# Patient Record
Sex: Female | Born: 1970 | Race: Asian | Hispanic: No | Marital: Married | Smoking: Never smoker
Health system: Southern US, Community
[De-identification: ages and names within clinical notes are randomized; demographics above are authoritative.]

## PROBLEM LIST (undated history)

## (undated) DIAGNOSIS — F419 Anxiety disorder, unspecified: Secondary | ICD-10-CM

## (undated) HISTORY — DX: Anxiety disorder, unspecified: F41.9

---

## 2002-09-22 ENCOUNTER — Other Ambulatory Visit: Admission: RE | Admit: 2002-09-22 | Discharge: 2002-09-22 | Payer: Self-pay | Admitting: Gynecology

## 2003-02-23 ENCOUNTER — Encounter: Admission: RE | Admit: 2003-02-23 | Discharge: 2003-02-23 | Payer: Self-pay | Admitting: Gynecology

## 2003-04-05 ENCOUNTER — Inpatient Hospital Stay (HOSPITAL_COMMUNITY): Admission: AD | Admit: 2003-04-05 | Discharge: 2003-04-08 | Payer: Self-pay | Admitting: Gynecology

## 2003-05-18 ENCOUNTER — Other Ambulatory Visit: Admission: RE | Admit: 2003-05-18 | Discharge: 2003-05-18 | Payer: Self-pay | Admitting: Gynecology

## 2004-07-27 ENCOUNTER — Ambulatory Visit: Payer: Self-pay | Admitting: Family Medicine

## 2009-10-29 ENCOUNTER — Inpatient Hospital Stay (HOSPITAL_COMMUNITY): Admission: AD | Admit: 2009-10-29 | Discharge: 2009-10-29 | Payer: Self-pay | Admitting: Obstetrics and Gynecology

## 2009-12-08 ENCOUNTER — Ambulatory Visit (HOSPITAL_COMMUNITY): Admission: RE | Admit: 2009-12-08 | Discharge: 2009-12-08 | Payer: Self-pay | Admitting: Obstetrics

## 2010-01-19 ENCOUNTER — Ambulatory Visit (HOSPITAL_COMMUNITY): Admission: RE | Admit: 2010-01-19 | Discharge: 2010-01-19 | Payer: Self-pay | Admitting: Obstetrics

## 2010-04-12 ENCOUNTER — Inpatient Hospital Stay (HOSPITAL_COMMUNITY): Admission: AD | Admit: 2010-04-12 | Discharge: 2010-04-14 | Payer: Self-pay | Admitting: Obstetrics

## 2010-08-22 LAB — CBC
HCT: 25.4 % — ABNORMAL LOW (ref 36.0–46.0)
Hemoglobin: 8.6 g/dL — ABNORMAL LOW (ref 12.0–15.0)
MCH: 30.9 pg (ref 26.0–34.0)
MCHC: 33.8 g/dL (ref 30.0–36.0)
MCV: 91.5 fL (ref 78.0–100.0)
Platelets: 148 10*3/uL — ABNORMAL LOW (ref 150–400)
RBC: 2.78 MIL/uL — ABNORMAL LOW (ref 3.87–5.11)
RDW: 14.2 % (ref 11.5–15.5)
WBC: 8.1 10*3/uL (ref 4.0–10.5)
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
## 2011-10-31 ENCOUNTER — Ambulatory Visit (INDEPENDENT_AMBULATORY_CARE_PROVIDER_SITE_OTHER): Payer: BC Managed Care – PPO | Admitting: Family Medicine

## 2011-10-31 VITALS — BP 117/80 | Ht 61.0 in | Wt 135.0 lb

## 2011-10-31 DIAGNOSIS — M25569 Pain in unspecified knee: Secondary | ICD-10-CM

## 2011-10-31 DIAGNOSIS — M79609 Pain in unspecified limb: Secondary | ICD-10-CM

## 2011-10-31 DIAGNOSIS — M79671 Pain in right foot: Secondary | ICD-10-CM

## 2011-10-31 DIAGNOSIS — M25561 Pain in right knee: Secondary | ICD-10-CM

## 2011-10-31 DIAGNOSIS — M722 Plantar fascial fibromatosis: Secondary | ICD-10-CM

## 2011-10-31 NOTE — Patient Instructions (Signed)
You have plantar fasciitis Take tylenol or aleve as needed for pain  Plantar fascia stretch for 20-30 seconds (do 3 of these) in morning Lowering/raise on a step exercises 3 x 10 once or twice a day - this is very important for long term recovery. Can add heel walks, toe walks forward and backward as well Ice heel for 15 minutes as needed. Avoid flat shoes/barefoot walking as much as possible. Arch straps have been shown to help with pain. Heel lifts may help with pain by avoiding fully stretching the plantar fascia except when doing home exercises. Orthotics with heel lift may be helpful. Steroid injection is a consideration for short term pain relief if you are struggling. Physical therapy is also an option. I would recommend continued running but start with 1:1 ZOX:WRUE for total of 10 minutes and increase jog time and total exercise time with each session. Follow up with me in 5-6 weeks for reevaluation.

## 2011-11-01 ENCOUNTER — Ambulatory Visit: Payer: Self-pay | Admitting: Family Medicine

## 2011-11-01 ENCOUNTER — Encounter: Payer: Self-pay | Admitting: Family Medicine

## 2011-11-01 DIAGNOSIS — M79671 Pain in right foot: Secondary | ICD-10-CM | POA: Insufficient documentation

## 2011-11-01 DIAGNOSIS — M25561 Pain in right knee: Secondary | ICD-10-CM | POA: Insufficient documentation

## 2011-11-01 NOTE — Progress Notes (Signed)
  Subjective:    Patient ID: Christy Sims, female    DOB: [birth year], 41 y.o.   MRN: [id number]  PCP: Dr. Noland Fordyce  HPI 41 yo F here for right foot and knee pain.  Patient reports for past 6 years she has had planter right foot pain in mid-arch. Didn't have any injury or trauma to start this. Was worse with flat shoes and without heels. Decreased activity level and started wearing shoes with good arch support and this improved though not completely. Did some stretching as well. Past 3-4 weeks has started to get back into running - ran 1 1/2 miles and felt good during but afterwards had pain worsening in right arch again. Scaled back running again. Not taking any meds or icing. Past few days medial right knee has been hurting also but no injury. No catching, locking, giving out of right knee.  History reviewed. No pertinent past medical history.  No current outpatient prescriptions on file prior to visit.    History reviewed. No pertinent past surgical history.  No Known Allergies  History   Social History  . Marital Status: Married    Spouse Name: N/A    Number of Children: N/A  . Years of Education: N/A   Occupational History  . Not on file.   Social History Main Topics  . Smoking status: Never Smoker   . Smokeless tobacco: Not on file  . Alcohol Use: Not on file  . Drug Use: Not on file  . Sexually Active: Not on file   Other Topics Concern  . Not on file   Social History Narrative  . No narrative on file    Family History  Problem Relation Age of Onset  . Diabetes Mother   . Hyperlipidemia Mother   . Hypertension Mother   . Diabetes Father   . Hyperlipidemia Father   . Hypertension Father   . Heart attack Neg Hx   . Sudden death Neg Hx     BP 117/80  Ht 5\' 1"  (1.549 m)  Wt 135 lb (61.236 kg)  BMI 25.51 kg/m2  Review of Systems See HPI above.    Objective:   Physical Exam Gen: NAD  R knee: No gross deformity, ecchymoses,  swelling. No TTP. FROM. Negative ant/post drawers. Negative valgus/varus testing. Negative lachmanns. Negative mcmurrays, apleys, patellar apprehension, clarkes. NV intact distally.  R foot: Mod pes planus. No gross deformity, swelling, bruising. TTP mid-portion arch medially.  No heel, other TTP about foot or ankle. FROM ankle without pain - 5/5 strength all directions. Neg ant drawer and talar tilt.  Neg thompsons. NVI distally.     Assessment & Plan:  1. Right foot pain - 2/2 plantar fasciitis.  Start home rehab protocol which was demonstrated today.  Arch straps.  Scaphoid pads provided.  Discussed OTC orthotics.  Icing, tylenol/motrin as needed.  Do not think cortisone injection would provide her much relief.  Consider custom orthotics and/or PT if not improving as expected.  2. Right knee pain - Believe this is a dynamic issue related to issue #1 - benign knee exam.  Reassured.

## 2011-11-01 NOTE — Assessment & Plan Note (Signed)
Believe this is a dynamic issue related to issue #1 - benign knee exam.  Reassured.

## 2011-11-01 NOTE — Assessment & Plan Note (Signed)
2/2 plantar fasciitis.  Start home rehab protocol which was demonstrated today.  Arch straps.  Scaphoid pads provided.  Discussed OTC orthotics.  Icing, tylenol/motrin as needed.  Do not think cortisone injection would provide her much relief.  Consider custom orthotics and/or PT if not improving as expected.

## 2012-08-07 ENCOUNTER — Ambulatory Visit: Payer: BC Managed Care – PPO | Admitting: Internal Medicine

## 2012-09-04 ENCOUNTER — Ambulatory Visit: Payer: BC Managed Care – PPO | Admitting: Internal Medicine

## 2012-09-22 ENCOUNTER — Ambulatory Visit (INDEPENDENT_AMBULATORY_CARE_PROVIDER_SITE_OTHER): Payer: BC Managed Care – PPO | Admitting: Internal Medicine

## 2012-09-22 ENCOUNTER — Encounter: Payer: Self-pay | Admitting: Internal Medicine

## 2012-09-22 VITALS — BP 110/70 | HR 53 | Temp 97.4°F | Resp 18 | Wt 120.0 lb

## 2012-09-22 DIAGNOSIS — J309 Allergic rhinitis, unspecified: Secondary | ICD-10-CM | POA: Insufficient documentation

## 2012-09-22 DIAGNOSIS — A6000 Herpesviral infection of urogenital system, unspecified: Secondary | ICD-10-CM

## 2012-09-22 DIAGNOSIS — F329 Major depressive disorder, single episode, unspecified: Secondary | ICD-10-CM

## 2012-09-22 DIAGNOSIS — F53 Postpartum depression: Secondary | ICD-10-CM | POA: Insufficient documentation

## 2012-09-22 DIAGNOSIS — O99345 Other mental disorders complicating the puerperium: Secondary | ICD-10-CM

## 2012-09-22 NOTE — Patient Instructions (Addendum)
See me as needed  Have fasiting labs activate my chart

## 2012-09-22 NOTE — Progress Notes (Signed)
  Subjective:    Patient ID: Christy Sims, female    DOB: 10-16-1970, 42 y.o.   MRN: [id number]  HPI Christy Sims is a new pt. here to establish care.  PMH  Of post partum depression and anxiety  (treated with Klonopin and Zoloft)  , allergic rhinitis and recent plantar fasciitis.    Overall doing well  Christy Sims is concerned about family history of diabetes and would like to be checked No polyuria no polyphagia  No Known Allergies History reviewed. No pertinent past medical history. History reviewed. No pertinent past surgical history. History   Social History  . Marital Status: Married    Spouse Name: N/A    Number of Children: N/A  . Years of Education: N/A   Occupational History  . Not on file.   Social History Main Topics  . Smoking status: Never Smoker   . Smokeless tobacco: Not on file  . Alcohol Use: No  . Drug Use: No  . Sexually Active: Yes    Birth Control/ Protection: Condom   Other Topics Concern  . Not on file   Social History Narrative  . No narrative on file   Family History  Problem Relation Age of Onset  . Diabetes Mother   . Hyperlipidemia Mother   . Hypertension Mother   . Diabetes Father   . Hyperlipidemia Father   . Hypertension Father   . Heart attack Neg Hx   . Sudden death Neg Hx    Patient Active Problem List  Diagnosis  . Plantar fasciitis  . Right foot pain  . Right knee pain  . Post partum depression  . Allergic rhinitis  . Genital herpes   No current outpatient prescriptions on file prior to visit.   No current facility-administered medications on file prior to visit.       Review of Systems See HPI    Objective:   Physical Exam  Physical Exam  Nursing note and vitals reviewed.  Constitutional: Christy Sims is oriented to person, place, and time. Christy Sims appears well-developed and well-nourished.  HENT:  Head: Normocephalic and atraumatic.  Cardiovascular: Normal rate and regular rhythm. Exam reveals no gallop and no friction rub.  No  murmur heard.  Pulmonary/Chest: Breath sounds normal. Christy Sims has no wheezes. Christy Sims has no rales.  Neurological: Christy Sims is alert and oriented to person, place, and time.  Skin: Skin is warm and dry.  Psychiatric: Christy Sims has a normal mood and affect. Her behavior is normal.            Assessment & Plan:  Allergic rhinitis  OTC antihistamine of choice  Postpartum derpression  Quiescent now  Had beenon Klonopin and Zoloft in the past Check labs today    History of genital herpes  Plantar fasciitis.  I spent 45 mins with this pt.

## 2012-09-23 ENCOUNTER — Other Ambulatory Visit: Payer: Self-pay | Admitting: Internal Medicine

## 2012-09-23 LAB — CBC WITH DIFFERENTIAL/PLATELET
HCT: 37 % (ref 36.0–46.0)
Hemoglobin: 12.9 g/dL (ref 12.0–15.0)
MCH: 32.9 pg (ref 26.0–34.0)
MCHC: 34.9 g/dL (ref 30.0–36.0)
MCV: 94.4 fL (ref 78.0–100.0)
Monocytes Absolute: 0.3 10*3/uL (ref 0.1–1.0)
Monocytes Relative: 6 % (ref 3–12)
Neutro Abs: 4.4 10*3/uL (ref 1.7–7.7)
Platelets: 267 10*3/uL (ref 150–400)
RBC: 3.92 MIL/uL (ref 3.87–5.11)
WBC: 6.1 10*3/uL (ref 4.0–10.5)

## 2012-09-23 LAB — LIPID PANEL: HDL: 68 mg/dL (ref >39)

## 2012-09-25 ENCOUNTER — Telehealth: Payer: Self-pay | Admitting: *Deleted

## 2012-09-25 ENCOUNTER — Telehealth: Payer: Self-pay | Admitting: Internal Medicine

## 2012-09-25 LAB — T3, FREE: T3, Free: 2.9 pg/mL (ref 2.3–4.2)

## 2012-09-25 NOTE — Telephone Encounter (Signed)
Spoke with pt and informed of thyroid results.  Advised to see me in 3-4 weeks for repeat.  Will add free T3 and free T4   Suspect subclinical hyperthyrodism

## 2012-09-25 NOTE — Telephone Encounter (Signed)
Message copied by Mathews Robinsons on Thu Sep 25, 2012  2:48 PM ------      Message from: Raechel Chute D      Created: Thu Sep 25, 2012  2:18 PM       Karen Kitchens             Add a free T3 and a free T4  To labs done on 4/14 ------

## 2012-10-16 ENCOUNTER — Ambulatory Visit (INDEPENDENT_AMBULATORY_CARE_PROVIDER_SITE_OTHER): Payer: BC Managed Care – PPO | Admitting: Internal Medicine

## 2012-10-16 ENCOUNTER — Encounter: Payer: Self-pay | Admitting: Internal Medicine

## 2012-10-16 VITALS — BP 110/68 | HR 57 | Temp 97.2°F | Resp 18 | Wt 118.0 lb

## 2012-10-16 DIAGNOSIS — R7989 Other specified abnormal findings of blood chemistry: Secondary | ICD-10-CM

## 2012-10-16 DIAGNOSIS — R6889 Other general symptoms and signs: Secondary | ICD-10-CM

## 2012-10-16 DIAGNOSIS — R946 Abnormal results of thyroid function studies: Secondary | ICD-10-CM

## 2012-10-16 NOTE — Progress Notes (Signed)
  Subjective:    Patient ID: Christy Sims, female    DOB: 12-13-1970, 42 y.o.   MRN: [id number]  HPI  Christy Sims here for follow up.  See labs.  She has minimally supressed TSH with normal free levels.  She did have "some abnormal thyroid lab test"  During her pregnancy.  She also describes post partum depression and anxiety.  She has not been on anti-anxiety or antidepressants for quite some time.    She denies weight loss, visual changes,  Tremors or palpitations.  She does have chronic insomnia which she takes benadryl every night.    She describes that most nights she wakes up just as she drifts off with difficulty breathing and a hot sensation over her body.  Lasts for one minute  No Known Allergies History reviewed. No pertinent past medical history. History reviewed. No pertinent past surgical history. History   Social History  . Marital Status: Married    Spouse Name: N/A    Number of Children: N/A  . Years of Education: N/A   Occupational History  . Not on file.   Social History Main Topics  . Smoking status: Never Smoker   . Smokeless tobacco: Not on file  . Alcohol Use: No  . Drug Use: No  . Sexually Active: Yes    Birth Control/ Protection: Condom   Other Topics Concern  . Not on file   Social History Narrative  . No narrative on file   Family History  Problem Relation Age of Onset  . Diabetes Mother   . Hyperlipidemia Mother   . Hypertension Mother   . Diabetes Father   . Hyperlipidemia Father   . Hypertension Father   . Heart attack Neg Hx   . Sudden death Neg Hx    Patient Active Problem List   Diagnosis Date Noted  . Abnormal TSH 10/16/2012  . Post partum depression 09/22/2012  . Allergic rhinitis 09/22/2012  . Genital herpes 09/22/2012  . Right foot pain 11/01/2011  . Right knee pain 11/01/2011  . Plantar fasciitis 10/31/2011   Current Outpatient Prescriptions on File Prior to Visit  Medication Sig Dispense Refill  . diphenhydrAMINE  (BENADRYL) 25 MG tablet Take 25 mg by mouth every 6 (six) hours as needed for itching.      Marland Kitchen doxylamine, Sleep, (UNISOM) 25 MG tablet Take 25 mg by mouth at bedtime as needed for sleep.       No current facility-administered medications on file prior to visit.         Review of Systems    see HPI Objective:   Physical Exam Physical Exam  Nursing note and vitals reviewed.  Constitutional: [name] oriented to person, place, and time. She appears well-developed and well-nourished.  HENT:  Head: Normocephalic and atraumatic.  Neck:  She has slight thyromegaly  No discrete nodules Cardiovascular: Normal rate and regular rhythm. Exam reveals no gallop and no friction rub.  No murmur heard.  Pulmonary/Chest: Breath sounds normal. She has no wheezes. She has no rales.  Neurological: [name] alert and oriented to person, place, and time.  Skin: Skin Sims warm and dry.  Psychiatric: She has a normal mood and affect. Her behavior Sims normal.         Assessment & Plan:  Suspressed TSH  Suspect subclinical hyperthyroidism  But with slight enlargement will get nuclear scan/uptake  Further management based on results

## 2012-10-16 NOTE — Patient Instructions (Addendum)
To have nuclear scan

## 2012-10-17 ENCOUNTER — Telehealth: Payer: Self-pay | Admitting: *Deleted

## 2012-10-17 NOTE — Telephone Encounter (Signed)
Notified pt of need for lab draw pt will come in at 8am Monday the 13th

## 2012-10-20 ENCOUNTER — Other Ambulatory Visit: Payer: Self-pay | Admitting: *Deleted

## 2012-10-20 ENCOUNTER — Other Ambulatory Visit: Payer: Self-pay | Admitting: Internal Medicine

## 2012-10-20 DIAGNOSIS — Z139 Encounter for screening, unspecified: Secondary | ICD-10-CM

## 2012-10-21 ENCOUNTER — Telehealth: Payer: Self-pay | Admitting: *Deleted

## 2012-10-21 LAB — COMPREHENSIVE METABOLIC PANEL
ALT: 13 U/L (ref 0–35)
AST: 19 U/L (ref 0–37)
Albumin: 4.1 g/dL (ref 3.5–5.2)
BUN: 15 mg/dL (ref 6–23)
Creat: 0.86 mg/dL (ref 0.50–1.10)

## 2012-10-21 NOTE — Telephone Encounter (Signed)
AVS and My Chart activation code mailed to pt

## 2012-11-04 ENCOUNTER — Ambulatory Visit (HOSPITAL_COMMUNITY): Payer: BC Managed Care – PPO

## 2012-11-05 ENCOUNTER — Other Ambulatory Visit (HOSPITAL_COMMUNITY): Payer: BC Managed Care – PPO

## 2012-11-10 ENCOUNTER — Encounter (HOSPITAL_COMMUNITY)
Admission: RE | Admit: 2012-11-10 | Discharge: 2012-11-10 | Disposition: A | Discharge status: Home / Self Care | Payer: BC Managed Care – PPO | Source: Ambulatory Visit | Attending: Internal Medicine | Admitting: Internal Medicine

## 2012-11-10 DIAGNOSIS — R946 Abnormal results of thyroid function studies: Secondary | ICD-10-CM | POA: Insufficient documentation

## 2012-11-10 DIAGNOSIS — R7989 Other specified abnormal findings of blood chemistry: Secondary | ICD-10-CM

## 2012-11-11 ENCOUNTER — Encounter (HOSPITAL_COMMUNITY)
Admission: RE | Admit: 2012-11-11 | Discharge: 2012-11-11 | Disposition: A | Discharge status: Home / Self Care | Payer: BC Managed Care – PPO | Source: Ambulatory Visit | Attending: Internal Medicine | Admitting: Internal Medicine

## 2012-11-11 ENCOUNTER — Encounter (HOSPITAL_COMMUNITY): Payer: Self-pay

## 2012-11-11 MED ORDER — SODIUM PERTECHNETATE TC 99M INJECTION
10.7000 | Freq: Once | INTRAVENOUS | Status: AC | PRN
  Administered 2012-11-11: 10.7 via INTRAVENOUS

## 2012-11-11 MED ORDER — SODIUM IODIDE I 131 CAPSULE
9.2000 | Freq: Once | INTRAVENOUS | Status: AC | PRN
  Administered 2012-11-10: 9.2 via ORAL

## 2012-11-13 ENCOUNTER — Telehealth: Payer: Self-pay | Admitting: Internal Medicine

## 2012-11-13 ENCOUNTER — Encounter: Payer: Self-pay | Admitting: Internal Medicine

## 2012-11-13 DIAGNOSIS — E0590 Thyrotoxicosis, unspecified without thyrotoxic crisis or storm: Secondary | ICD-10-CM | POA: Insufficient documentation

## 2012-11-13 NOTE — Telephone Encounter (Signed)
Spoke with pt and informed of thyroid scan results.  Will refer to endocrinology for further assessment

## 2012-11-13 NOTE — Telephone Encounter (Signed)
Left message on mobile for pt to call office to discuss thyroid

## 2012-11-25 ENCOUNTER — Ambulatory Visit: Payer: BC Managed Care – PPO | Admitting: Endocrinology

## 2012-12-11 ENCOUNTER — Ambulatory Visit: Payer: BC Managed Care – PPO | Admitting: Internal Medicine

## 2012-12-15 ENCOUNTER — Encounter: Payer: Self-pay | Admitting: Internal Medicine

## 2012-12-15 ENCOUNTER — Ambulatory Visit (INDEPENDENT_AMBULATORY_CARE_PROVIDER_SITE_OTHER): Payer: BC Managed Care – PPO | Admitting: Internal Medicine

## 2012-12-15 VITALS — BP 110/68 | HR 61 | Temp 98.7°F | Resp 10 | Ht 62.0 in | Wt 120.0 lb

## 2012-12-15 DIAGNOSIS — E0590 Thyrotoxicosis, unspecified without thyrotoxic crisis or storm: Secondary | ICD-10-CM

## 2012-12-15 DIAGNOSIS — R7989 Other specified abnormal findings of blood chemistry: Secondary | ICD-10-CM

## 2012-12-15 DIAGNOSIS — R6889 Other general symptoms and signs: Secondary | ICD-10-CM

## 2012-12-15 NOTE — Progress Notes (Signed)
Patient ID: Christy Sims, female   DOB: 1971-04-08, 41 y.o.   MRN: [id number]   HPI  Christy Sims is a 42 y.o.-year-old female, referred by her PCP, Dr.Schoenhoff, for evaluation for low TSH.  I reviewed pt's previous tests:  Lab Results  Component Value Date   TSH 0.317* 09/23/2012   FREET4 1.33 09/23/2012  A thyroid U&S in  11/11/2012 returned normal: 24 hour uptake 23.2% (10-35%); homogeneous activity throughout both lobes; no focal hot or cold nodules identified.  She remembers being told she had slightly abnormal levels when she was pregnant in 04/2010. Had a miscarriage in 06/2009.  Pt denies recent contrast studies, using iodine disinfectants, increasing iodine in diet (kelp, seaweed), taking OTC herbal or weight loss supplements, being on Amiodarone or other meds that can decrease TSH. No use of steroids.  No dysphagia, odynophagia, hoarseness, lumps in neck but has SOB with lying down in the 30 min after lays down - only when goes to sleep, not if watching TV or reading in bed.   Pt denies heat intolerance, tremors, anxiety, palpitations, insomnia, hyperdefecation, but had recent weight loss (intentionsl) - 25 lbs after the pregnancy.   Pt does not have a FH of autoimmune ds, thyroid ds, or thyroid cancer. No h/o radiation tx to head or neck.  I reviewed her chart and she also has a history of depression/anxiety during pregnancies - resolved now.  ROS: Constitutional: no weight gain/loss, + fatigue, no subjective hyperthermia/hypothermia, + poor sleep Eyes: no blurry vision, no xerophthalmia ENT: no sore throat, no nodules palpated in throat, no dysphagia/odynophagia, no hoarseness Cardiovascular: no CP/SOB/palpitations/leg swelling Respiratory: no cough/SOB Gastrointestinal: no N/V/D/C Musculoskeletal: no muscle/joint aches Skin: no rashes Neurological: no tremors/numbness/tingling/dizziness Psychiatric: no depression/anxiety now  Past Medical History  Diagnosis Date  .  Anxiety     DURING PREGNANCIES   History reviewed. No pertinent past surgical history.  History   Social History  . Marital Status: Married    Spouse Name: N/A    Number of Children: 2: 79 and 2 y/o   Occupational History  . accountant   Social History Main Topics  . Smoking status: Never Smoker   . Smokeless tobacco: Not on file  . Alcohol Use: No  . Drug Use: No  . Sexually Active: Yes    Birth Control/ Protection: Condom   Current Outpatient Prescriptions on File Prior to Visit  Medication Sig Dispense Refill  . diphenhydrAMINE (BENADRYL) 25 MG tablet Take 25 mg by mouth every 6 (six) hours as needed for itching.      Marland Kitchen doxylamine, Sleep, (UNISOM) 25 MG tablet Take 25 mg by mouth at bedtime as needed for sleep.       No current facility-administered medications on file prior to visit.   No Known Allergies  Family History  Problem Relation Age of Onset  . Diabetes Mother   . Hyperlipidemia Mother   . Hypertension Mother   . Diabetes Father   . Hyperlipidemia Father   . Hypertension Father   . Heart attack Neg Hx   . Sudden death Neg Hx    PE: BP 110/68  Pulse 61  Temp(Src) 98.7 F (37.1 C) (Oral)  Resp 10  Ht 5\' 2"  (1.575 m)  Wt 120 lb (54.432 kg)  BMI 21.94 kg/m2  SpO2 96%  LMP 12/13/2012 Wt Readings from Last 3 Encounters:  12/15/12 120 lb (54.432 kg)  10/16/12 118 lb (53.524 kg)  09/22/12 120 lb (54.432 kg)  Constitutional: normal weight, in NAD Eyes: PERRLA, EOMI, no exophthalmos, no lid lag, no stare ENT: moist mucous membranes, no thyromegaly, no cervical lymphadenopathy Cardiovascular: RRR, No MRG Respiratory: CTA B Gastrointestinal: abdomen soft, NT, ND, BS+ Musculoskeletal: no deformities, strength intact in all 4 Skin: moist, warm, no rashes Neurological: no tremor with outstretched hands, DTR normal in all 4  ASSESSMENT: 1. Subclinical hyperthyroidism - 11/11/2012 - normal thyroid U&S: 24 hour uptake 23.2% (10-35%). Homogeneous  activity throughout both lobes. No focal hot or cold nodules are identified.  PLAN:  1. Pt with recently-found subclinical hyperthyroidism. It does not appear to be caused by exogenous causes (increased iodine intake or exposure, meds, weight loss supplements, factitious) - We discussed about possible causes for thyrotoxicosis, which can be:  Graves' disease  Toxic uni- or multinodular goiter  Thyroiditis  - To differentiate between these, we usually do a thyroid uptake and scan. She had it done 1 mo ago and this was normal; - to confirm her SH, will check TSH, free T4, free T3 and TSI today. - we did discuss possible complications of hyperthyroidism - we discussed possible treatments for the above conditions - we also discussed the need to follow her thyroid tests and intervene if TSH decreases or if she develops overt hyperthyroidism - I advised herto join MyChart and I will send her the results there - i will see her back in 6 months, but might need to come sooner for labs  Office Visit on 12/15/2012  Component Date Value Range Status  . TSH 12/15/2012 0.23* 0.35 - 5.50 uIU/mL Final  . Free T4 12/15/2012 1.10  0.60 - 1.60 ng/dL Final  . T3, Free 16/03/9603 2.3  2.3 - 4.2 pg/mL Final  . TSI 12/15/2012 39  <140 % baseline Final   Msg sent to pt:  Dear Ms Christy Sims, Here are the results of the thyroid tests. The TSH is a little bit lower but the free T4 and free T3 and also the antibodies are normal. I think we should just follow the thyroid tests for now as this is only mild subclinical hyperthyroidism. I will repeat the tests when you return in 6 months, but please let me know if you develop symptoms that could be related to an overactive thyroid: weight loss, tremors, palpitations, frequent bowel movements, anxiety, irritability. Please let me know if you have any questions. Sincerely, Carlus Pavlov MD

## 2012-12-15 NOTE — Patient Instructions (Signed)
Please try to join MyChart - I will send you the labs through there. Please return in 6 months. We might need to repeat the thyroid labs sooner.

## 2012-12-16 LAB — TSH: TSH: 0.23 u[IU]/mL — ABNORMAL LOW (ref 0.35–5.50)

## 2012-12-16 LAB — T3, FREE: T3, Free: 2.3 pg/mL (ref 2.3–4.2)

## 2012-12-16 LAB — T4, FREE: Free T4: 1.1 ng/dL (ref 0.60–1.60)

## 2012-12-18 LAB — THYROID STIMULATING IMMUNOGLOBULIN: TSI: 39 % baseline (ref <140)

## 2013-04-09 ENCOUNTER — Encounter: Payer: Self-pay | Admitting: Family Medicine

## 2013-04-09 ENCOUNTER — Ambulatory Visit (INDEPENDENT_AMBULATORY_CARE_PROVIDER_SITE_OTHER): Payer: BC Managed Care – PPO | Admitting: Family Medicine

## 2013-04-09 ENCOUNTER — Ambulatory Visit: Payer: BC Managed Care – PPO | Admitting: Family Medicine

## 2013-04-09 VITALS — BP 126/75 | HR 88 | Ht 62.0 in | Wt 120.0 lb

## 2013-04-09 DIAGNOSIS — M7061 Trochanteric bursitis, right hip: Secondary | ICD-10-CM

## 2013-04-09 DIAGNOSIS — M75101 Unspecified rotator cuff tear or rupture of right shoulder, not specified as traumatic: Secondary | ICD-10-CM

## 2013-04-09 DIAGNOSIS — M722 Plantar fascial fibromatosis: Secondary | ICD-10-CM

## 2013-04-09 DIAGNOSIS — M67919 Unspecified disorder of synovium and tendon, unspecified shoulder: Secondary | ICD-10-CM

## 2013-04-09 DIAGNOSIS — M25559 Pain in unspecified hip: Secondary | ICD-10-CM

## 2013-04-09 DIAGNOSIS — M25551 Pain in right hip: Secondary | ICD-10-CM

## 2013-04-09 DIAGNOSIS — M25569 Pain in unspecified knee: Secondary | ICD-10-CM

## 2013-04-09 DIAGNOSIS — M76899 Other specified enthesopathies of unspecified lower limb, excluding foot: Secondary | ICD-10-CM

## 2013-04-09 DIAGNOSIS — M25561 Pain in right knee: Secondary | ICD-10-CM

## 2013-04-09 NOTE — Patient Instructions (Signed)
You have strained your subscapularis (one of the rotator cuff muscles). Try to avoid painful activities (overhead activities, lifting with extended arm) as much as possible. Do home exercise program with theraband and scapular stabilization exercises daily - these are very important for long term relief. Nitro patches and/or subacromial injection may be beneficial to help with pain and to decrease inflammation If not improving at follow-up we will consider further imaging, physical therapy and/or nitro patches.  You have mild lateral hamstring tendinopathy. Start leg curls, swings, and running lunge - 3 sets of 10 once a day.  Can add ankle weight if these become too easy.  You have mild trochanteric bursitis. Ice the area of pain 15 minutes at a time 3-4 times a day as needed for pain. Pick 2-3 of the stretches for this area and hold 20-30 seconds, repeat 3 times daily. Hip side raise exercise 3 sets of 10 once a day - ankle weight if this becomes too easy. Cortisone injection is an option if pain is severe.  You have plantar fasciitis Take tylenol or aleve as needed for pain  Plantar fascia stretch for 20-30 seconds (do 3 of these) in morning Lowering/raise on a step exercises 3 x 10 once or twice a day - this is very important for long term recovery. Can add heel walks, toe walks forward and backward as well Ice heel for 15 minutes as needed. Avoid flat shoes/barefoot walking as much as possible. Orthotics with heel lift may be helpful (our green insoles or Dr. Jari Sportsman active series insoles). Physical therapy is also an option.  In general rest and focus on the rehab the next 2 weeks. Ok to cross train with swimming or cycling.

## 2013-04-14 ENCOUNTER — Encounter: Payer: Self-pay | Admitting: Family Medicine

## 2013-04-14 DIAGNOSIS — M7060 Trochanteric bursitis, unspecified hip: Secondary | ICD-10-CM | POA: Insufficient documentation

## 2013-04-14 DIAGNOSIS — M75101 Unspecified rotator cuff tear or rupture of right shoulder, not specified as traumatic: Secondary | ICD-10-CM | POA: Insufficient documentation

## 2013-04-14 DIAGNOSIS — M25551 Pain in right hip: Secondary | ICD-10-CM | POA: Insufficient documentation

## 2013-04-14 NOTE — Assessment & Plan Note (Signed)
Right hamstring tendinopathy - shown home exercise program.  Consider formal PT.

## 2013-04-14 NOTE — Assessment & Plan Note (Addendum)
specifically subscapularis.  Start home exercise program.  Consider formal PT, nitro patches, injection if not improving.

## 2013-04-14 NOTE — Assessment & Plan Note (Signed)
strain back in June based on history.  Shown home exercises, stretches to do daily.  Arch binders, inserts with arch support, avoid flat shoes/barefoot walking.  Icing, tylenol/aleve as needed.  Consider formal PT if not improving.

## 2013-04-14 NOTE — Assessment & Plan Note (Signed)
Trochanteric bursitis - very mild.  Start home strengthening exercises, stretches.  Icing as needed.  Consider injection if worsening.

## 2013-04-14 NOTE — Progress Notes (Signed)
Patient ID: Christy Sims, female   DOB: [birth year], 42 y.o.   MRN: [id number]  PCP: Levon Hedger, MD  Subjective:   HPI: Patient is a 42 y.o. female here for multiple issues.  Patient reports running back in early June when she started to develop a sharp pain in right arch. Felt a pop at the time but able to keep running. No swelling or bruising. To this point has only been able to run 2 miles still due to pain. Has developed pain as well in posterior right knee, lateral hip. Also having right shoulder pain for past 3 weeks without known injury. Thinks pain in shoulder started when doing a lot of planks, pushups.  Past Medical History  Diagnosis Date  . Anxiety     DURING PREGNANCIES    Current Outpatient Prescriptions on File Prior to Visit  Medication Sig Dispense Refill  . diphenhydrAMINE (BENADRYL) 25 MG tablet Take 25 mg by mouth every 6 (six) hours as needed for itching.      Marland Kitchen doxylamine, Sleep, (UNISOM) 25 MG tablet Take 25 mg by mouth at bedtime as needed for sleep.       No current facility-administered medications on file prior to visit.    History reviewed. No pertinent past surgical history.  No Known Allergies  History   Social History  . Marital Status: Married    Spouse Name: N/A    Number of Children: N/A  . Years of Education: N/A   Occupational History  . Not on file.   Social History Main Topics  . Smoking status: Never Smoker   . Smokeless tobacco: Not on file  . Alcohol Use: No  . Drug Use: No  . Sexual Activity: Yes    Birth Control/ Protection: Condom   Other Topics Concern  . Not on file   Social History Narrative  . No narrative on file    Family History  Problem Relation Age of Onset  . Diabetes Mother   . Hyperlipidemia Mother   . Hypertension Mother   . Diabetes Father   . Hyperlipidemia Father   . Hypertension Father   . Heart attack Neg Hx   . Sudden death Neg Hx     BP 126/75  Pulse 88  Ht 5\' 2"  (1.575 m)   Wt 120 lb (54.432 kg)  BMI 21.94 kg/m2  Review of Systems: See HPI above.    Objective:  Physical Exam:  Gen: NAD  Right foot/ankle: No gross deformity, swelling, ecchymoses FROM TTP in body of plantar fascia. Negative ant drawer and talar tilt.   Negative syndesmotic compression. Thompsons test negative. NV intact distally.  Right knee: No gross deformity, ecchymoses, swelling. Mild TTP lateral hamstring tendon.  No joint line, other TTP. FROM. Negative ant/post drawers. Negative valgus/varus testing. Negative lachmanns. Negative mcmurrays, apleys, patellar apprehension, clarkes. NV intact distally.  Right hip: No gross deformity, swelling, bruising. Mild TTP greater trochanter.  No other TTP. FROM with negative logroll. Hip abduction 5-/5.  Right shoulder: No swelling, ecchymoses.  No gross deformity. No TTP. FROM. Mild positive hawkins.  Negative neers. Negative Speeds, Yergasons. Strength 5/5 with empty can and resisted internal/external rotation.  Mild pain resisted IR. Negative apprehension. NV intact distally.    Assessment & Plan:  1. Plantar fasciitis - strain back in June based on history.  Shown home exercises, stretches to do daily.  Arch binders, inserts with arch support, avoid flat shoes/barefoot walking.  Icing, tylenol/aleve as needed.  Consider formal  PT if not improving.  2. Trochanteric bursitis - very mild.  Start home strengthening exercises, stretches.  Icing as needed.  Consider injection if worsening.  3. Rotator cuff strain - specifically subscapularis.  Start home exercise program.  Consider formal PT, nitro patches, injection if not improving.  4. Right hamstring tendinopathy - shown home exercise program.  Consider formal PT.

## 2013-04-16 ENCOUNTER — Other Ambulatory Visit: Payer: Self-pay

## 2013-06-18 ENCOUNTER — Ambulatory Visit: Payer: BC Managed Care – PPO | Admitting: Internal Medicine

## 2013-06-30 ENCOUNTER — Ambulatory Visit: Payer: BC Managed Care – PPO | Admitting: Internal Medicine

## 2013-07-07 ENCOUNTER — Other Ambulatory Visit: Payer: Self-pay | Admitting: *Deleted

## 2013-07-07 ENCOUNTER — Ambulatory Visit (INDEPENDENT_AMBULATORY_CARE_PROVIDER_SITE_OTHER): Payer: BC Managed Care – PPO | Admitting: Internal Medicine

## 2013-07-07 ENCOUNTER — Encounter: Payer: Self-pay | Admitting: Internal Medicine

## 2013-07-07 VITALS — BP 104/64 | HR 62 | Temp 98.7°F | Resp 12 | Wt 127.7 lb

## 2013-07-07 DIAGNOSIS — E0590 Thyrotoxicosis, unspecified without thyrotoxic crisis or storm: Secondary | ICD-10-CM

## 2013-07-07 LAB — T3: T3, Total: 93.3 ng/dL (ref 80.0–204.0)

## 2013-07-07 LAB — TSH: TSH: 0.38 u[IU]/mL (ref 0.35–5.50)

## 2013-07-07 LAB — T4, FREE: FREE T4: 1.06 ng/dL (ref 0.60–1.60)

## 2013-07-07 NOTE — Patient Instructions (Signed)
Please return in 6 month.  Please stop at the lab.

## 2013-07-07 NOTE — Progress Notes (Signed)
Patient ID: Christy Sims, female   DOB: 09/11/1970, 43 y.o.   MRN: [id number]   HPI  Christy Sims is a 43 y.o.-year-old female, initially referred by her PCP, Dr.Schoenhoff, for evaluation for subclinical hyperthyroidism. Last visit 6 mo ago.  I reviewed pt's previous tests:  Lab Results  Component Value Date   TSH 0.23* 12/15/2012   TSH 0.317* 09/23/2012   FREET4 1.10 12/15/2012   FREET4 1.33 09/23/2012  free T3 2.3 TSI 39 (<150%).  Reviewed hx: She remembers being told she had slightly abnormal levels when she was pregnant in 04/2010. Had a miscarriage in 06/2009.A thyroid uptake and scan (11/11/2012) was normal: 24 hour uptake 23.2% (10-35%); homogeneous activity throughout both lobes; no focal hot or cold nodules identified.  At last visit, we checked labs and decided to have her come back in 6 mo for recheck, but I advised her to let me know if she developed hyperthyroid sxs. She did not have these.  Pt denies: - heat intolerance - tremors - anxiety - palpitations - insomnia - hyperdefecation She had weight loss (intentional) last year - 25 lbs after the pregnancy. She had recent weight gain: 7 lbs since last visit.  No dysphagia, odynophagia, hoarseness, lumps in neck.  I reviewed pt's medications, allergies, PMH, social hx, family hx and no changes required, except as mentioned above.  ROS: Constitutional: no weight gain/loss, no fatigue, no subjective hyperthermia/hypothermia, + poor sleep Eyes: no blurry vision, no xerophthalmia ENT: no sore throat, no nodules palpated in throat, no dysphagia/odynophagia, no hoarseness Cardiovascular: no CP/SOB/palpitations/+ L leg swelling this past summer >> now resolved Respiratory: no cough/SOB Gastrointestinal: no N/V/D/C Musculoskeletal: no muscle/joint aches Skin: no rashes Neurological: no tremors/numbness/tingling/dizziness  PE: BP 104/64  Pulse 62  Temp(Src) 98.7 F (37.1 C) (Oral)  Resp 12  Wt 127 lb 11.2 oz (57.924 kg)   SpO2 98% Wt Readings from Last 3 Encounters:  07/07/13 127 lb 11.2 oz (57.924 kg)  04/09/13 120 lb (54.432 kg)  12/15/12 120 lb (54.432 kg)   Constitutional: normal weight, in NAD Eyes: PERRLA, EOMI, no exophthalmos, no lid lag, no stare ENT: moist mucous membranes, no thyromegaly, no cervical lymphadenopathy Cardiovascular: RRR, No MRG Respiratory: CTA B Gastrointestinal: abdomen soft, NT, ND, BS+ Musculoskeletal: no deformities, strength intact in all 4 Skin: moist, warm, no rashes Neurological: no tremor with outstretched hands, DTR normal in all 4  ASSESSMENT: 1. Subclinical hyperthyroidism - 11/11/2012 - normal thyroid U&S: 24 hour uptake 23.2% (10-35%). Homogeneous activity throughout both lobes. No focal hot or cold nodules are identified.  PLAN:  1. Pt with subclinical hyperthyroidism dx'ed last year. It does not appear to be caused by exogenous causes (increased iodine intake or exposure, meds, weight loss supplements, factitious). An uptake and scan was normal. She is asymptomatic. - will check TSH, free T4, free T3 today. - we discussed the need to follow her thyroid tests and intervene if TSH decreases or if she develops overt hyperthyroidism - I will see her back in 6 months, but might need to come sooner for labs  Office Visit on 07/07/2013  Component Date Value Range Status  . TSH 07/07/2013 0.38  0.35 - 5.50 uIU/mL Final  . Free T4 07/07/2013 1.06  0.60 - 1.60 ng/dL Final   Labs normalized >> will repeat in 6 mo when she comes back.

## 2013-12-02 ENCOUNTER — Encounter: Payer: Self-pay | Admitting: Internal Medicine

## 2013-12-02 ENCOUNTER — Ambulatory Visit (HOSPITAL_BASED_OUTPATIENT_CLINIC_OR_DEPARTMENT_OTHER)
Admission: RE | Admit: 2013-12-02 | Discharge: 2013-12-02 | Disposition: A | Discharge status: Home / Self Care | Payer: BC Managed Care – PPO | Source: Ambulatory Visit | Attending: Internal Medicine | Admitting: Internal Medicine

## 2013-12-02 ENCOUNTER — Ambulatory Visit (INDEPENDENT_AMBULATORY_CARE_PROVIDER_SITE_OTHER): Payer: BC Managed Care – PPO | Admitting: Internal Medicine

## 2013-12-02 VITALS — BP 125/76 | HR 82 | Resp 16 | Ht 61.5 in | Wt 126.0 lb

## 2013-12-02 DIAGNOSIS — M7989 Other specified soft tissue disorders: Secondary | ICD-10-CM

## 2013-12-02 DIAGNOSIS — M79662 Pain in left lower leg: Secondary | ICD-10-CM

## 2013-12-02 DIAGNOSIS — M79609 Pain in unspecified limb: Secondary | ICD-10-CM | POA: Insufficient documentation

## 2013-12-02 NOTE — Progress Notes (Addendum)
   Subjective:    Patient ID: Christy Sims, female    DOB: [birth year], 43 y.o.   MRN: [id number]  HPI  Christy Sims is here for acute visit.   She has been having intermittant left calf swelling and pain off and on last few weeks    She is an active runner 2-3 miles 2-3 times per week .Marland Kitchen. Denies injury or trauma to left side.  She did develop a trochanteric bursitis to her right side which is being treated by sports MD  No Known Allergies Past Medical History  Diagnosis Date  . Anxiety     DURING PREGNANCIES   No past surgical history on file. History   Social History  . Marital Status: Married    Spouse Name: N/A    Number of Children: N/A  . Years of Education: N/A   Occupational History  . Not on file.   Social History Main Topics  . Smoking status: Never Smoker   . Smokeless tobacco: Not on file  . Alcohol Use: No  . Drug Use: No  . Sexual Activity: Yes    Birth Control/ Protection: Condom   Other Topics Concern  . Not on file   Social History Narrative  . No narrative on file   Family History  Problem Relation Age of Onset  . Diabetes Mother   . Hyperlipidemia Mother   . Hypertension Mother   . Diabetes Father   . Hyperlipidemia Father   . Hypertension Father   . Heart attack Neg Hx   . Sudden death Neg Hx    Patient Active Problem List   Diagnosis Date Noted  . Trochanteric bursitis 04/14/2013  . Rotator cuff syndrome of right shoulder 04/14/2013  . Right hip pain 04/14/2013  . Subclinical hyperthyroidism 11/13/2012  . Abnormal TSH 10/16/2012  . Post partum depression 09/22/2012  . Allergic rhinitis 09/22/2012  . Genital herpes 09/22/2012  . Right foot pain 11/01/2011  . Right knee pain 11/01/2011  . Plantar fasciitis 10/31/2011   Current Outpatient Prescriptions on File Prior to Visit  Medication Sig Dispense Refill  . diphenhydrAMINE (BENADRYL) 25 MG tablet Take 25 mg by mouth every 6 (six) hours as needed for itching.      Marland Kitchen. doxylamine, Sleep,  (UNISOM) 25 MG tablet Take 25 mg by mouth at bedtime as needed for sleep.       No current facility-administered medications on file prior to visit.       Review of Systems See HPI    Objective:   Physical Exam Physical Exam  Nursing note and vitals reviewed.  Constitutional: She is oriented to person, place, and time. She appears well-developed and well-nourished.  HENT:  Head: Normocephalic and atraumatic.  Cardiovascular: Normal rate and regular rhythm. Exam reveals no gallop and no friction rub.  No murmur heard.  Pulmonary/Chest: Breath sounds normal. She has no wheezes. She has no rales.  Neurological: She is alert and oriented to person, place, and time.  Skin: Skin is warm and dry.  ExtL  Mild edema of calf  HOman's sign positive Psychiatric: She has a normal mood and affect. Her behavior is normal.          Assessment & Plan:  Calf pain , intermittant edema:    Will get venous ultrasound  Further management based on results  Addendum   DVT neg,   Wear compression hose,  If pain occurs ok to see Dr. Pearletha ForgeHudnall

## 2013-12-02 NOTE — Patient Instructions (Signed)
To xray today  

## 2013-12-21 ENCOUNTER — Ambulatory Visit: Payer: BC Managed Care – PPO | Admitting: Internal Medicine

## 2013-12-24 ENCOUNTER — Ambulatory Visit: Payer: BC Managed Care – PPO | Admitting: Internal Medicine

## 2014-01-08 ENCOUNTER — Ambulatory Visit: Payer: BC Managed Care – PPO | Admitting: Internal Medicine

## 2014-02-16 ENCOUNTER — Ambulatory Visit (INDEPENDENT_AMBULATORY_CARE_PROVIDER_SITE_OTHER): Payer: BC Managed Care – PPO | Admitting: Internal Medicine

## 2014-02-16 ENCOUNTER — Encounter: Payer: Self-pay | Admitting: Internal Medicine

## 2014-02-16 VITALS — BP 112/60 | HR 68 | Temp 98.3°F | Resp 12 | Wt 129.0 lb

## 2014-02-16 DIAGNOSIS — R7989 Other specified abnormal findings of blood chemistry: Secondary | ICD-10-CM

## 2014-02-16 DIAGNOSIS — E0590 Thyrotoxicosis, unspecified without thyrotoxic crisis or storm: Secondary | ICD-10-CM

## 2014-02-16 NOTE — Patient Instructions (Signed)
Please stop at the lab. If the labs are normal, you can have them checked by Dr Constance Goltz every year. Please let me know if you develop hyperthyroid symptoms, as discussed.

## 2014-02-16 NOTE — Progress Notes (Signed)
Patient ID: Christy Sims, female   DOB: 04-25-1971, 43 y.o.   MRN: [id number]   HPI  Christy Sims is a 43 y.o.-year-old female, initially referred by her PCP, Dr.Schoenhoff, for evaluation for subclinical hyperthyroidism. Last visit 6 mo ago.  Reviewed hx: She remembers being told she had slightly abnormal levels when she was pregnant in 04/2010. Had a miscarriage in 06/2009.  I reviewed pt's previous tests - tests normalized at last check 6 mo ago:  Lab Results  Component Value Date   TSH 0.38 07/07/2013   TSH 0.23* 12/15/2012   TSH 0.317* 09/23/2012   FREET4 1.06 07/07/2013   FREET4 1.10 12/15/2012   FREET4 1.33 09/23/2012  free T3 2.3 TSI 39 (<150%).  A thyroid uptake and scan (11/11/2012) was normal: 24 hour uptake 23.2% (10-35%); homogeneous activity throughout both lobes; no focal hot or cold nodules identified.  Pt denies: - heat intolerance - tremors - anxiety - palpitations - insomnia - hyperdefecation  No dysphagia, odynophagia, hoarseness, lumps in neck.  I reviewed pt's medications, allergies, PMH, social hx, family hx and no changes required, except as mentioned above.  ROS: Constitutional: no weight gain/loss, no fatigue, no subjective hyperthermia/hypothermia, + poor sleep, + more stress at work Eyes: no blurry vision, no xerophthalmia ENT: no sore throat, no nodules palpated in throat, no dysphagia/odynophagia, no hoarseness Cardiovascular: no CP/SOB/palpitations/leg swelling >> now resolved Respiratory: no cough/SOB Gastrointestinal: no N/V/D/C Musculoskeletal: no muscle/joint aches Skin: no rashes Neurological: no tremors/numbness/tingling/dizziness  PE: BP 112/60  Pulse 68  Temp(Src) 98.3 F (36.8 C) (Oral)  Resp 12  Wt 129 lb (58.514 kg)  SpO2 97% Wt Readings from Last 3 Encounters:  02/16/14 129 lb (58.514 kg)  12/02/13 126 lb (57.153 kg)  07/07/13 127 lb 11.2 oz (57.924 kg)   Constitutional: normal weight, in NAD Eyes: PERRLA, EOMI, no  exophthalmos, no lid lag, no stare ENT: moist mucous membranes, no thyromegaly, no cervical lymphadenopathy Cardiovascular: RRR, No MRG Respiratory: CTA B Gastrointestinal: abdomen soft, NT, ND, BS+ Musculoskeletal: no deformities, strength intact in all 4 Skin: moist, warm, no rashes Neurological: no tremor with outstretched hands, DTR normal in all 4  ASSESSMENT: 1. Subclinical hyperthyroidism - 11/11/2012 - normal thyroid U&S: 24 hour uptake 23.2% (10-35%). Homogeneous activity throughout both lobes. No focal hot or cold nodules are identified.  PLAN:  1. Pt with subclinical hyperthyroidism dx'ed last year. It does not appear to be caused by exogenous causes (increased iodine intake or exposure, meds, weight loss supplements, factitious). An uptake and scan was normal. She is asymptomatic. Last TFTs from 6 mo ago >> normal - will check TSH, free T4, free T3 today. - I will see her back if the TFTs are abnormal, if they are normal >> OK to continue to have annual TFT checks with PCP  Component     Latest Ref Rng 02/16/2014  TSH     0.35 - 4.50 uIU/mL 0.23 (L)  Free T4     0.60 - 1.60 ng/dL 4.09  T3, Free     2.3 - 4.2 pg/mL 2.7  TSH again a Sims low >> will need to repeat in 3 months.

## 2014-02-17 LAB — T4, FREE: FREE T4: 1.05 ng/dL (ref 0.60–1.60)

## 2014-02-17 LAB — TSH: TSH: 0.23 u[IU]/mL — ABNORMAL LOW (ref 0.35–4.50)

## 2014-02-17 LAB — T3, FREE: T3, Free: 2.7 pg/mL (ref 2.3–4.2)

## 2014-04-12 ENCOUNTER — Encounter: Payer: Self-pay | Admitting: Internal Medicine

## 2014-04-22 ENCOUNTER — Other Ambulatory Visit: Payer: Self-pay | Admitting: *Deleted

## 2014-04-22 DIAGNOSIS — Z0000 Encounter for general adult medical examination without abnormal findings: Secondary | ICD-10-CM

## 2014-04-25 NOTE — Progress Notes (Signed)
Subjective:    Patient ID: Christy MayansSuzy P Shean, female    DOB: 14-Jul-1970, 43 y.o.   MRN: [id number]  HPI 02/2014 endocrine note ASSESSMENT: 1. Subclinical hyperthyroidism - 11/11/2012 - normal thyroid U&S: 24 hour uptake 23.2% (10-35%). Homogeneous activity throughout both lobes. No focal hot or cold nodules are identified.  PLAN:  1. Pt with subclinical hyperthyroidism dx'ed last year. It does not appear to be caused by exogenous causes (increased iodine intake or exposure, meds, weight loss supplements, factitious). An uptake and scan was normal. She is asymptomatic. Last TFTs from 6 mo ago >> normal - will check TSH, free T4, free T3 today. - I will see her back if the TFTs are abnormal, if they are normal >> OK to continue to have annual TFT checks with PCP  Component  Latest Ref Rng 02/16/2014  TSH  0.35 - 4.50 uIU/mL 0.23 (L)  Free T4  0.60 - 1.60 ng/dL 7.821.05  T3, Free  2.3 - 4.2 pg/mL 2.7  TSH again a little low >> will need to repeat in 3 months.             Therapy Notes    11/2013 note Calf pain , intermittant edema: Will get venous ultrasound Further management based on results  Addendum DVT neg, Wear compression hose, If pain occurs ok to see Dr. Pearletha ForgeHudnall.  Today  [name] is here for CPE HM: pap and mm per GYN,  Pt smoked for less than 5 years,  She quit in 2003.   Pt reports 3 episodes one week apart of substernal chest pain radiating to both arms and through to her back.  Associated with SOB NO N/V  .   Never went for evaluation as she thought it was stress related.  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   No heartburn.  Pain last 30-40 mins.    Risk  Pt is a non-smoker.  NO FH MI  or CVA.    Normal lipids.      No Known Allergies Past Medical History  Diagnosis Date  . Anxiety     DURING PREGNANCIES   No past surgical history on file. History   Social History  . Marital Status: Married    Spouse Name: N/A    Number  of Children: N/A  . Years of Education: N/A   Occupational History  . Not on file.   Social History Main Topics  . Smoking status: Never Smoker   . Smokeless tobacco: Not on file  . Alcohol Use: No  . Drug Use: No  . Sexual Activity: Yes    Birth Control/ Protection: Condom   Other Topics Concern  . Not on file   Social History Narrative   Family History  Problem Relation Age of Onset  . Diabetes Mother   . Hyperlipidemia Mother   . Hypertension Mother   . Diabetes Father   . Hyperlipidemia Father   . Hypertension Father   . Heart attack Neg Hx   . Sudden death Neg Hx    Patient Active Problem List   Diagnosis Date Noted  . Trochanteric bursitis 04/14/2013  . Rotator cuff syndrome of right shoulder 04/14/2013  . Right hip pain 04/14/2013  . Subclinical hyperthyroidism 11/13/2012  . Abnormal TSH 10/16/2012  . Post partum depression 09/22/2012  . Allergic rhinitis 09/22/2012  . Genital herpes 09/22/2012  . Right foot pain 11/01/2011  . Right knee pain 11/01/2011  . Plantar fasciitis 10/31/2011   Current  Outpatient Prescriptions on File Prior to Visit  Medication Sig Dispense Refill  . clonazePAM (KLONOPIN) 0.5 MG tablet     . diphenhydrAMINE (BENADRYL) 25 MG tablet Take 25 mg by mouth every 6 (six) hours as needed for itching.    . valACYclovir (VALTREX) 500 MG tablet      No current facility-administered medications on file prior to visit.       Review of Systems  Respiratory: Negative for cough, choking, chest tightness, shortness of breath and wheezing.   Cardiovascular: Negative for chest pain, palpitations and leg swelling.  All other systems reviewed and are negative.      Objective:   Physical Exam  Physical Exam  Nursing note and vitals reviewed.  Constitutional: She is oriented to person, place, and time. She appears well-developed and well-nourished.  HENT:  Head: Normocephalic and atraumatic.  Right Ear: Tympanic membrane and ear canal  normal. No drainage. Tympanic membrane is not injected and not erythematous.  Left Ear: Tympanic membrane and ear canal normal. No drainage. Tympanic membrane is not injected and not erythematous.  Nose: Nose normal. Right sinus exhibits no maxillary sinus tenderness and no frontal sinus tenderness. Left sinus exhibits no maxillary sinus tenderness and no frontal sinus tenderness.  Mouth/Throat: Oropharynx is clear and moist. No oral lesions. No oropharyngeal exudate.  Eyes: Conjunctivae and EOM are normal. Pupils are equal, round, and reactive to light.  Neck: Normal range of motion. Neck supple. No JVD present. Carotid bruit is not present. No mass and no thyromegaly present.  Cardiovascular: Normal rate, regular rhythm, S1 normal, S2 normal and intact distal pulses. Exam reveals no gallop and no friction rub.  No murmur heard.  Pulses:  Carotid pulses are 2+ on the right side, and 2+ on the left side.  Dorsalis pedis pulses are 2+ on the right side, and 2+ on the left side.  No carotid bruit. No LE edema  Pulmonary/Chest: Breath sounds normal. She has no wheezes. She has no rales. She exhibits no tenderness. Breast no discrete mass no nipple discharge no axillary adenpathy bilaterally Abdominal: Soft. Bowel sounds are normal. She exhibits no distension and no mass. There is no hepatosplenomegaly. There is no tenderness. There is no CVA tenderness.  Musculoskeletal: Normal range of motion.  No active synovitis to joints.  Lymphadenopathy:  She has no cervical adenopathy.  She has no axillary adenopathy.  Right: No inguinal and no supraclavicular adenopathy present.  Left: No inguinal and no supraclavicular adenopathy present.  Neurological: She is alert and oriented to person, place, and time. She has normal strength and normal reflexes. She displays no tremor. No cranial nerve deficit or sensory deficit. Coordination and gait normal.  Skin: Skin is warm and dry. No rash noted. No cyanosis.  Nails show no clubbing.  Psychiatric: She has a normal mood and affect. Her speech is normal and behavior is normal. Cognition and memory are normal.         Assessment & Plan:  HM:  Does not meeti criteria for screening Chest CT.   UTD with vaccines, pap and mm advised 3D mm  Atypical chest pain  EKg no acute changes.  Pain atypical but pt would like to have stress testing .  If pain recurs she is to get EKG at that time. Ok ot take baby ASA  Will also check GB U/S    OK to refer to cardiology  Abnormal TSH/ Subclinical hyperthyroidism:    followed by Dr. Elvera Lennox.  H/o genital herpes  Followed by Dr. Ernestina PennaFogleman

## 2014-04-26 ENCOUNTER — Ambulatory Visit (INDEPENDENT_AMBULATORY_CARE_PROVIDER_SITE_OTHER): Payer: BC Managed Care – PPO | Admitting: Internal Medicine

## 2014-04-26 ENCOUNTER — Encounter: Payer: Self-pay | Admitting: *Deleted

## 2014-04-26 ENCOUNTER — Encounter: Payer: Self-pay | Admitting: Internal Medicine

## 2014-04-26 VITALS — BP 118/72 | HR 69 | Resp 16 | Ht 61.5 in | Wt 129.0 lb

## 2014-04-26 DIAGNOSIS — R0789 Other chest pain: Secondary | ICD-10-CM

## 2014-04-26 DIAGNOSIS — Z0000 Encounter for general adult medical examination without abnormal findings: Secondary | ICD-10-CM | POA: Diagnosis not present

## 2014-04-26 LAB — LIPID PANEL
CHOL/HDL RATIO: 2.2 ratio
Cholesterol: 166 mg/dL (ref 0–200)
HDL: 76 mg/dL (ref >39)
LDL CALC: 76 mg/dL (ref 0–99)
TRIGLYCERIDES: 71 mg/dL (ref <150)
VLDL: 14 mg/dL (ref 0–40)

## 2014-04-26 LAB — CBC WITH DIFFERENTIAL/PLATELET
Basophils Absolute: 0 10*3/uL (ref 0.0–0.1)
Basophils Relative: 1 % (ref 0–1)
EOS ABS: 0.1 10*3/uL (ref 0.0–0.7)
Eosinophils Relative: 2 % (ref 0–5)
HCT: 40.3 % (ref 36.0–46.0)
HEMOGLOBIN: 13.5 g/dL (ref 12.0–15.0)
LYMPHS ABS: 1.4 10*3/uL (ref 0.7–4.0)
Lymphocytes Relative: 29 % (ref 12–46)
MCH: 31.8 pg (ref 26.0–34.0)
MCHC: 33.5 g/dL (ref 30.0–36.0)
MCV: 94.8 fL (ref 78.0–100.0)
MONOS PCT: 5 % (ref 3–12)
Monocytes Absolute: 0.2 10*3/uL (ref 0.1–1.0)
NEUTROS PCT: 63 % (ref 43–77)
Neutro Abs: 3 10*3/uL (ref 1.7–7.7)
PLATELETS: 275 10*3/uL (ref 150–400)
RBC: 4.25 MIL/uL (ref 3.87–5.11)
RDW: 12.9 % (ref 11.5–15.5)
WBC: 4.7 10*3/uL (ref 4.0–10.5)

## 2014-04-26 LAB — POCT URINALYSIS DIPSTICK
Bilirubin, UA: NEGATIVE
Glucose, UA: NEGATIVE
Ketones, UA: NEGATIVE
Leukocytes, UA: NEGATIVE
NITRITE UA: NEGATIVE
PH UA: 7.5
PROTEIN UA: NEGATIVE
RBC UA: NEGATIVE
SPEC GRAV UA: 1.01
UROBILINOGEN UA: 0.2

## 2014-04-26 LAB — COMPLETE METABOLIC PANEL WITH GFR
ALT: 9 U/L (ref 0–35)
AST: 17 U/L (ref 0–37)
Albumin: 4.1 g/dL (ref 3.5–5.2)
Alkaline Phosphatase: 50 U/L (ref 39–117)
BILIRUBIN TOTAL: 1.1 mg/dL (ref 0.2–1.2)
BUN: 15 mg/dL (ref 6–23)
CO2: 27 meq/L (ref 19–32)
CREATININE: 0.76 mg/dL (ref 0.50–1.10)
Calcium: 8.9 mg/dL (ref 8.4–10.5)
Chloride: 105 mEq/L (ref 96–112)
GFR, Est African American: >89 mL/min
GFR, Est Non African American: >89 mL/min
Glucose, Bld: 93 mg/dL (ref 70–99)
Potassium: 4.3 mEq/L (ref 3.5–5.3)
SODIUM: 140 meq/L (ref 135–145)
Total Protein: 7.3 g/dL (ref 6.0–8.3)

## 2014-04-26 NOTE — Patient Instructions (Signed)
Will set up referral to cardiology

## 2014-04-27 LAB — VITAMIN D 25 HYDROXY (VIT D DEFICIENCY, FRACTURES): VIT D 25 HYDROXY: 13 ng/mL — AB (ref 30–100)

## 2014-04-28 ENCOUNTER — Ambulatory Visit (HOSPITAL_BASED_OUTPATIENT_CLINIC_OR_DEPARTMENT_OTHER)
Admission: RE | Admit: 2014-04-28 | Discharge: 2014-04-28 | Disposition: A | Discharge status: Home / Self Care | Payer: BC Managed Care – PPO | Source: Ambulatory Visit | Attending: Internal Medicine | Admitting: Internal Medicine

## 2014-04-28 DIAGNOSIS — R0789 Other chest pain: Secondary | ICD-10-CM

## 2014-04-28 DIAGNOSIS — R079 Chest pain, unspecified: Secondary | ICD-10-CM | POA: Diagnosis not present

## 2014-04-28 DIAGNOSIS — R109 Unspecified abdominal pain: Secondary | ICD-10-CM | POA: Diagnosis not present

## 2014-05-02 ENCOUNTER — Other Ambulatory Visit: Payer: Self-pay | Admitting: Internal Medicine

## 2014-05-02 MED ORDER — CHOLECALCIFEROL 1.25 MG (50000 UT) PO TABS: ORAL_TABLET | ORAL | Status: AC

## 2014-05-03 NOTE — Progress Notes (Signed)
Christy Sims is aware of her U/S results-eh

## 2014-05-03 NOTE — Progress Notes (Signed)
Christy Sims is aware that of her lab results. She will pick up the Vit D today at the pharmacy. I also made her a 3 month F/U appointment-eh

## 2014-05-18 ENCOUNTER — Other Ambulatory Visit: Payer: BC Managed Care – PPO

## 2014-05-19 ENCOUNTER — Ambulatory Visit: Payer: BC Managed Care – PPO | Admitting: Cardiology

## 2014-08-03 ENCOUNTER — Ambulatory Visit (INDEPENDENT_AMBULATORY_CARE_PROVIDER_SITE_OTHER): Payer: 59 | Admitting: Internal Medicine

## 2014-08-03 ENCOUNTER — Encounter: Payer: Self-pay | Admitting: Internal Medicine

## 2014-08-03 VITALS — BP 112/75 | HR 67 | Resp 16 | Ht 62.0 in | Wt 126.0 lb

## 2014-08-03 DIAGNOSIS — R079 Chest pain, unspecified: Secondary | ICD-10-CM

## 2014-08-03 DIAGNOSIS — E559 Vitamin D deficiency, unspecified: Secondary | ICD-10-CM

## 2014-08-03 DIAGNOSIS — E0590 Thyrotoxicosis, unspecified without thyrotoxic crisis or storm: Secondary | ICD-10-CM

## 2014-08-03 LAB — T4, FREE: Free T4: 1.42 ng/dL (ref 0.80–1.80)

## 2014-08-03 LAB — TSH: TSH: 0.449 u[IU]/mL (ref 0.350–4.500)

## 2014-08-03 LAB — T3, FREE: T3 FREE: 2.9 pg/mL (ref 2.3–4.2)

## 2014-08-03 NOTE — Progress Notes (Signed)
Subjective:    Patient ID: Christy Sims, female    DOB: [birth year], 44 y.o.   MRN: [id number]  HPI 04/2014 note Assessment & Plan:  HM: Does not meeti criteria for screening Chest CT. UTD with vaccines, pap and mm advised 3D mm  Atypical chest pain EKg no acute changes. Pain atypical but pt would like to have stress testing . If pain recurs she is to get EKG at that time. Ok ot take baby ASA Will also check GB U/S OK to refer to cardiology  Abnormal TSH/ Subclinical hyperthyroidism: followed by Dr. Elvera Lennox.   H/o genital herpes Followed by Christy Sims is here to follow up on her vitamin D deficiency  She tells me that at work there has been a recent merger and she has been so busy she could not keep up with her MD  Appts.  She has only taken one capsule of her 50,000 IU dose.   Subclinical hyperthyroidism: pt did not get labs ordered by her endocrinologist last fall.  She denies wt loss, hair loss, palpitations  Atypical chest pain  Pt was referrred to cardiology but did not keep that appt.  She tells me she cancelled as she had no further episodes of chest pain and thinks her symptoms were due to job stress   No Known Allergies Past Medical History  Diagnosis Date  . Anxiety     DURING PREGNANCIES   No past surgical history on file. History   Social History  . Marital Status: Married    Spouse Name: N/A  . Number of Children: N/A  . Years of Education: N/A   Occupational History  . Not on file.   Social History Main Topics  . Smoking status: Never Smoker   . Smokeless tobacco: Not on file  . Alcohol Use: No  . Drug Use: No  . Sexual Activity: Yes    Birth Control/ Protection: Condom   Other Topics Concern  . Not on file   Social History Narrative   Family History  Problem Relation Age of Onset  . Diabetes Mother   . Hyperlipidemia Mother   . Hypertension Mother   . Diabetes Father   . Hyperlipidemia Father   .  Hypertension Father   . Heart attack Neg Hx   . Sudden death Neg Hx    Patient Active Problem List   Diagnosis Date Noted  . Trochanteric bursitis 04/14/2013  . Rotator cuff syndrome of right shoulder 04/14/2013  . Right hip pain 04/14/2013  . Subclinical hyperthyroidism 11/13/2012  . Abnormal TSH 10/16/2012  . Post partum depression 09/22/2012  . Allergic rhinitis 09/22/2012  . Genital herpes 09/22/2012  . Right foot pain 11/01/2011  . Right knee pain 11/01/2011  . Plantar fasciitis 10/31/2011   Current Outpatient Prescriptions on File Prior to Visit  Medication Sig Dispense Refill  . Cholecalciferol 50000 UNITS TABS Take one tablet once a week for 12 weeks 12 tablet 0  . clonazePAM (KLONOPIN) 0.5 MG tablet     . diphenhydrAMINE (BENADRYL) 25 MG tablet Take 25 mg by mouth every 6 (six) hours as needed for itching.    . valACYclovir (VALTREX) 500 MG tablet      No current facility-administered medications on file prior to visit.       Review of Systems See hPI    Objective:   Physical Exam  Physical Exam  Nursing note and vitals reviewed.  Constitutional: She is oriented to person, place, and time. She appears well-developed and well-nourished.  HENT:  Head: Normocephalic and atraumatic.  Cardiovascular: Normal rate and regular rhythm. Exam reveals no gallop and no friction rub.  No murmur heard.  Pulmonary/Chest: Breath sounds normal. She has no wheezes. She has no rales.  Neurological: She is alert and oriented to person, place, and time.  Skin: Skin is warm and dry.  Psychiatric: She has a normal mood and affect. Her behavior is normal.             Assessment & Plan:  Vitamin D deficiency  Will recheck today  Further management based on results.    Sublcinical hyperthyroidism  Will check labs today.  Pt advised to make her follow up appt with D.r Elvera Sims.  She states she will do this herself due to heavy work schedule  Atypical chest pain:    No  further episodes

## 2014-08-04 ENCOUNTER — Other Ambulatory Visit: Payer: Self-pay | Admitting: Internal Medicine

## 2014-08-04 ENCOUNTER — Telehealth: Payer: Self-pay | Admitting: *Deleted

## 2014-08-04 ENCOUNTER — Ambulatory Visit: Payer: Self-pay | Admitting: Internal Medicine

## 2014-08-04 LAB — VITAMIN D 25 HYDROXY (VIT D DEFICIENCY, FRACTURES): Vit D, 25-Hydroxy: 16 ng/mL — ABNORMAL LOW (ref 30–100)

## 2014-08-04 MED ORDER — VITAMIN D (ERGOCALCIFEROL) 1.25 MG (50000 UNIT) PO CAPS: 50000.0000 [IU] | ORAL_CAPSULE | ORAL | Status: AC

## 2014-08-04 NOTE — Telephone Encounter (Signed)
Christy Sims is aware of her lab results -eh

## 2014-08-04 NOTE — Telephone Encounter (Signed)
-----   Message from Kendrick Rancheborah D Schoenhoff, MD sent at 08/04/2014 11:02 AM EST ----- Call pt and let her know that her thyroid labs are normal .but her vitamin D is still very low  I will e-scribe 50,000 units of D3 #8 (she has 4 at home)  Take one a week for 8 more weeks Then take 1000-2000 units daily   Ok to mail labs to her

## 2014-08-16 ENCOUNTER — Encounter: Payer: Self-pay | Admitting: Internal Medicine

## 2014-08-16 ENCOUNTER — Ambulatory Visit (INDEPENDENT_AMBULATORY_CARE_PROVIDER_SITE_OTHER): Payer: 59 | Admitting: Internal Medicine

## 2014-08-16 VITALS — BP 130/83 | HR 59 | Temp 98.5°F | Resp 16 | Ht 62.0 in | Wt 128.0 lb

## 2014-08-16 DIAGNOSIS — J0100 Acute maxillary sinusitis, unspecified: Secondary | ICD-10-CM

## 2014-08-16 MED ORDER — AZITHROMYCIN 250 MG PO TABS: ORAL_TABLET | ORAL | Status: AC

## 2014-08-16 NOTE — Progress Notes (Signed)
Subjective:    Patient ID: Christy Sims, female    DOB: January 11, 1971, 44 y.o.   MRN: [id number]  HPI  Christy Sims is here for acute visit.   2 weeks of nasal congestion and cough.  No fever ,   Cough improved  But maxillary sinuses painful   No Known Allergies Past Medical History  Diagnosis Date  . Anxiety     DURING PREGNANCIES   History reviewed. No pertinent past surgical history. History   Social History  . Marital Status: Married    Spouse Name: N/A  . Number of Children: N/A  . Years of Education: N/A   Occupational History  . Not on file.   Social History Main Topics  . Smoking status: Never Smoker   . Smokeless tobacco: Never Used  . Alcohol Use: No  . Drug Use: No  . Sexual Activity: Yes    Birth Control/ Protection: Condom   Other Topics Concern  . Not on file   Social History Narrative   Family History  Problem Relation Age of Onset  . Diabetes Mother   . Hyperlipidemia Mother   . Hypertension Mother   . Diabetes Father   . Hyperlipidemia Father   . Hypertension Father   . Heart attack Neg Hx   . Sudden death Neg Hx    Patient Active Problem List   Diagnosis Date Noted  . Trochanteric bursitis 04/14/2013  . Rotator cuff syndrome of right shoulder 04/14/2013  . Right hip pain 04/14/2013  . Subclinical hyperthyroidism 11/13/2012  . Abnormal TSH 10/16/2012  . Post partum depression 09/22/2012  . Allergic rhinitis 09/22/2012  . Genital herpes 09/22/2012  . Right foot pain 11/01/2011  . Right knee pain 11/01/2011  . Plantar fasciitis 10/31/2011   Current Outpatient Prescriptions on File Prior to Visit  Medication Sig Dispense Refill  . Cholecalciferol 50000 UNITS TABS Take one tablet once a week for 12 weeks 12 tablet 0  . clonazePAM (KLONOPIN) 0.5 MG tablet     . diphenhydrAMINE (BENADRYL) 25 MG tablet Take 25 mg by mouth every 6 (six) hours as needed for itching.    . valACYclovir (VALTREX) 500 MG tablet     . Vitamin D, Ergocalciferol,  (DRISDOL) 50000 UNITS CAPS capsule Take 1 capsule (50,000 Units total) by mouth every 7 (seven) days. 8 capsule 0   No current facility-administered medications on file prior to visit.      Review of Systems See HPI    Objective:   Physical Exam Physical Exam  Constitutional: She is oriented to person, place, and time. She appears well-developed and well-nourished. She is cooperative.  HENT:  Head: Normocephalic and atraumatic.  Right Ear: A middle ear effusion is present.  Left Ear: A middle ear effusion is present.  Nose: Mucosal edema present. Right sinus exhibits maxillary sinus tenderness. Left sinus exhibits maxillary sinus tenderness.  Mouth/Throat: Posterior oropharyngeal erythema present.  Serous effusion bilaterally  Eyes: Conjunctivae and EOM are normal. Pupils are equal, round, and reactive to light.  Neck: Neck supple. Carotid bruit is not present. No mass present.  Cardiovascular: Regular rhythm, normal heart sounds, intact distal pulses and normal pulses. Exam reveals no gallop and no friction rub.  No murmur heard.  Pulmonary/Chest: Breath sounds normal. She has no wheezes. She has no rhonchi. She has no rales.  Neurological: She is alert and oriented to person, place, and time.  Skin: Skin is warm and dry. No abrasion, no bruising, no ecchymosis and  no rash noted. No cyanosis. Nails show no clubbing.  Psychiatric: She has a normal mood and affect. Her speech is normal and behavior is normal.             Assessment & Plan:  Sinusitis:  Will give Z-pak

## 2015-09-07 IMAGING — US US EXTREM LOW VENOUS*L*
1 series · 13 of 20 positions shown · non-contrast
Comparison: None.

CLINICAL DATA: LEFT LOWER EXTREMITY PAIN AND EDEMA



[Series 1: us extrem low venous*left* · 13 of 20 slices shown]
[im 1/20]
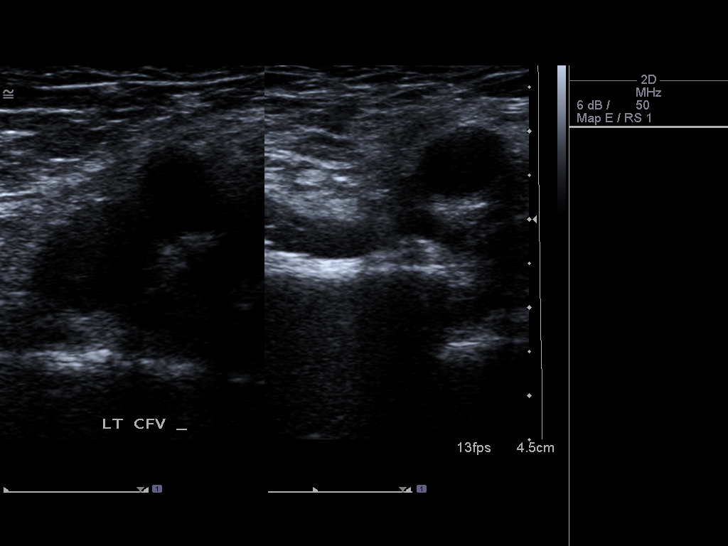
[im 3/20]
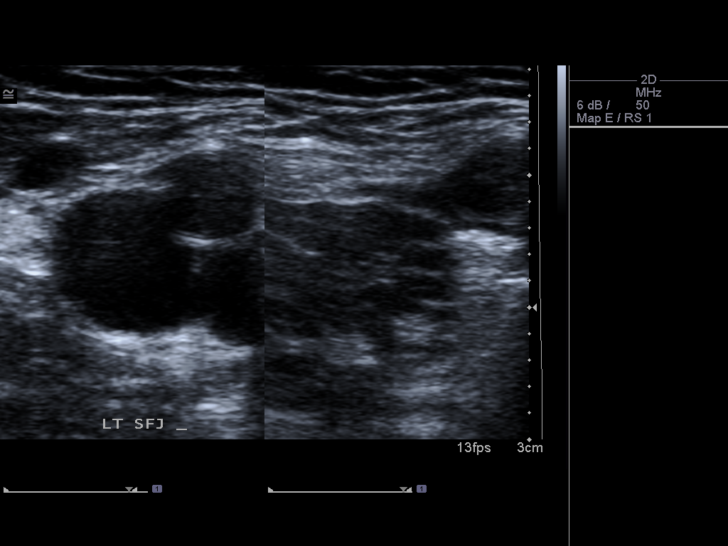
[im 4/20]
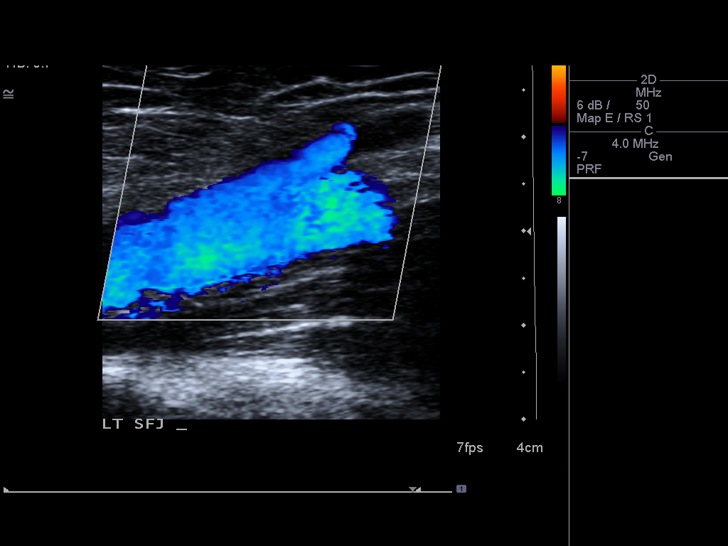
[im 6/20]
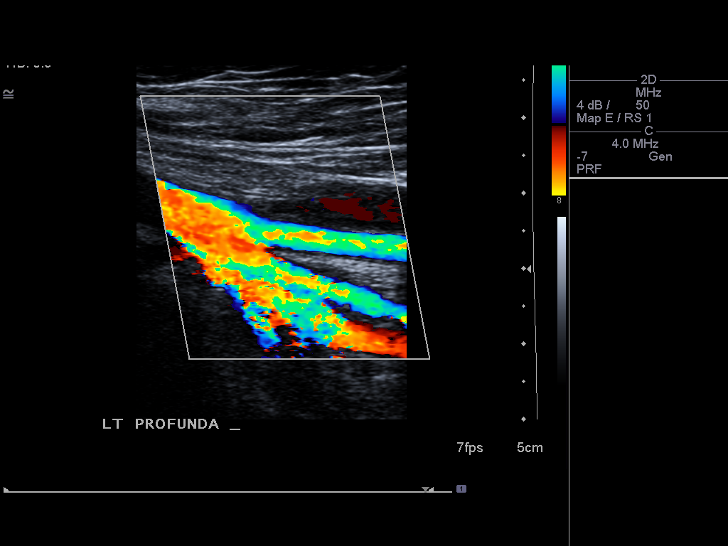
[im 7/20]
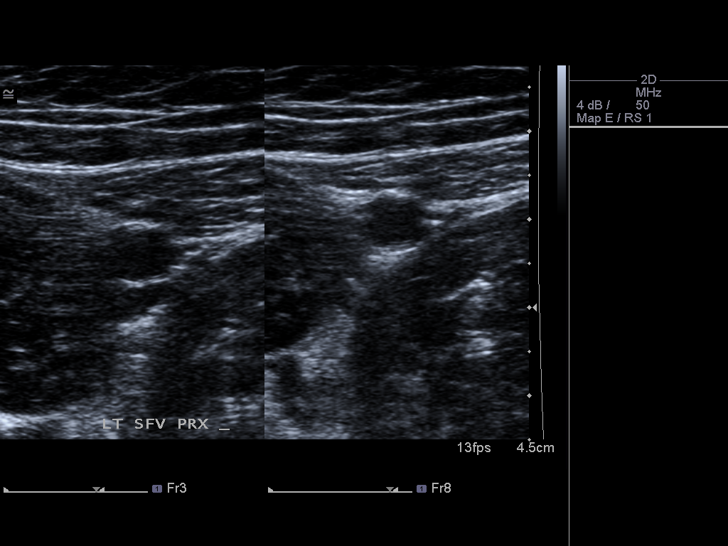
[im 9/20]
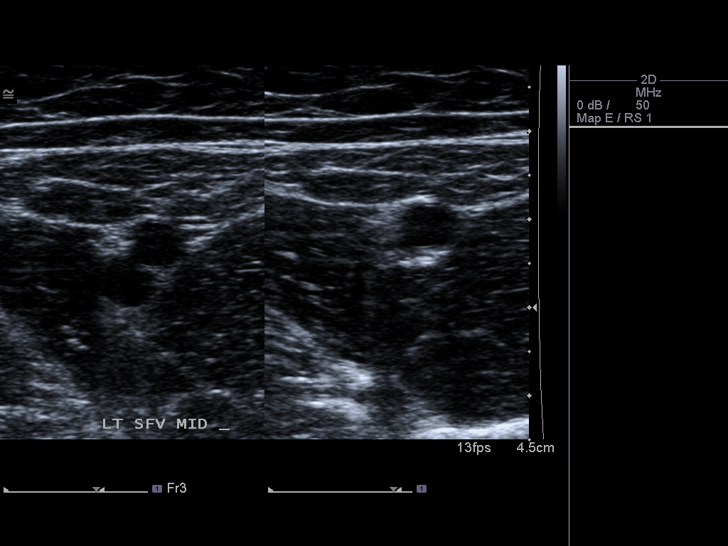
[im 11/20]
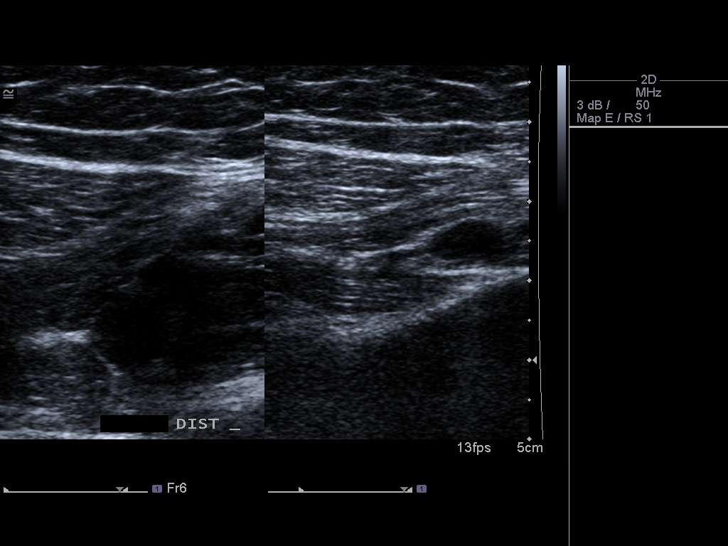
[im 12/20]
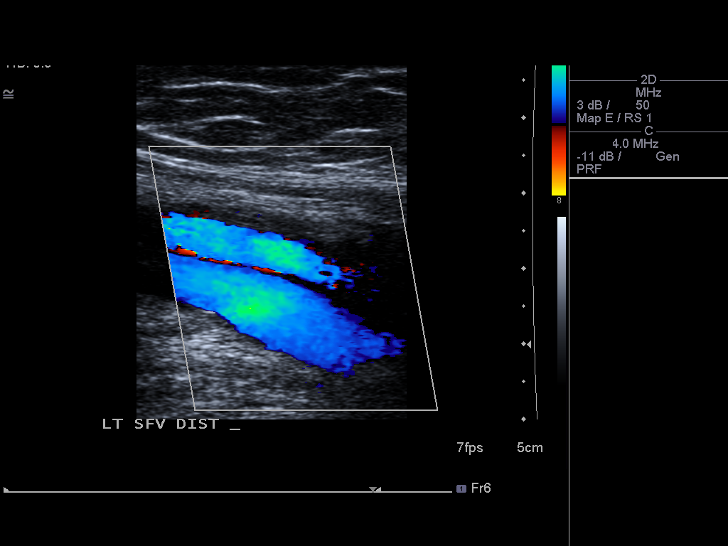
[im 14/20]
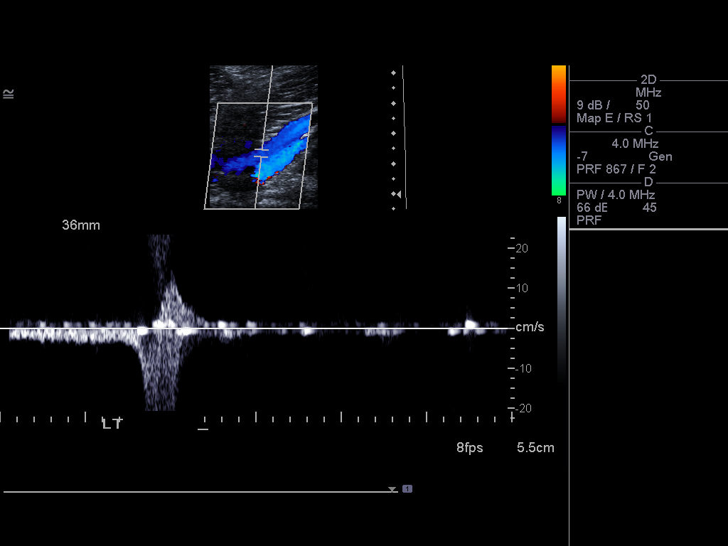
[im 15/20]
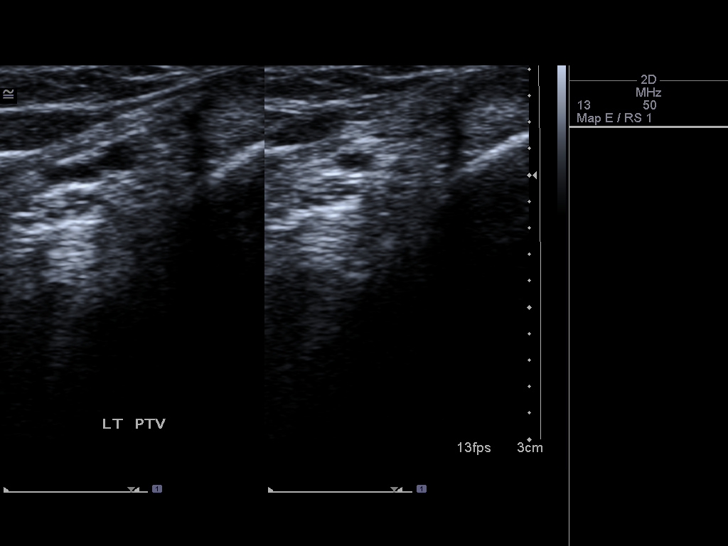
[im 17/20]
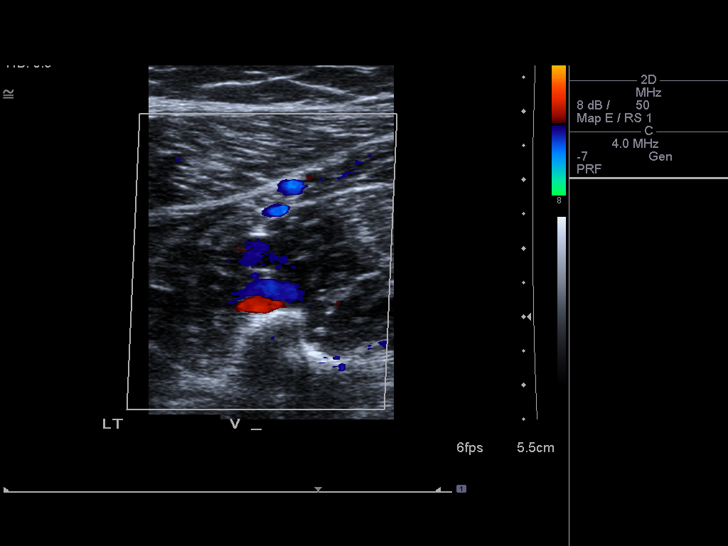
[im 18/20]
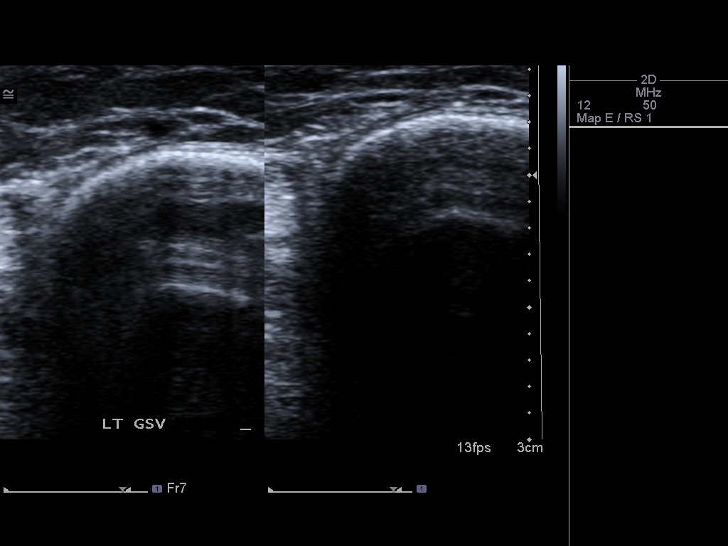
[im 20/20]
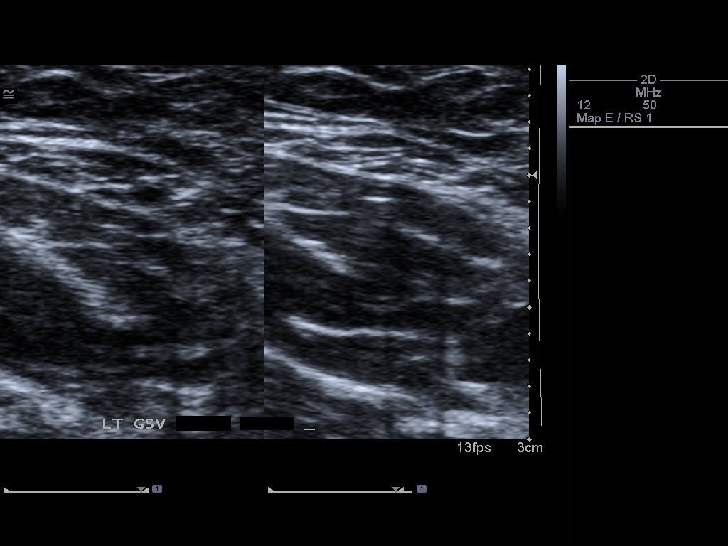

[13 of 20 positions shown; findings below may reference images not displayed]

FINDINGS: Common Femoral Vein: No evidence of thrombus. Normal
compressibility, respiratory phasicity and response to augmentation.

Saphenofemoral Junction: No evidence of thrombus. Normal
compressibility and flow on color Doppler imaging.

Profunda Femoral Vein: No evidence of thrombus. Normal
compressibility and flow on color Doppler imaging.

Femoral Vein: No evidence of thrombus. Normal compressibility,
respiratory phasicity and response to augmentation.

Popliteal Vein: No evidence of thrombus. Normal compressibility,
respiratory phasicity and response to augmentation.

Calf Veins: No evidence of thrombus. Normal compressibility and flow
on color Doppler imaging.

Superficial Great Saphenous Vein: No evidence of thrombus. Normal
compressibility and flow on color Doppler imaging.

Venous Reflux:  None.

Other Findings:  None.
IMPRESSION: No evidence of deep venous thrombosis.

## 2016-02-01 IMAGING — US US ABDOMEN COMPLETE
1 series · 14 of 25 positions shown · non-contrast
Comparison: None.

CLINICAL DATA: C/o sporadic Lt chest pain radiating into Lt upper
back x 1 month, patient feels it's stress-related

EXAM:
ULTRASOUND ABDOMEN COMPLETE

[Series 1: us abdomen complete · 0.20mm/px · 14 of 65 slices shown]
[im 1/65]
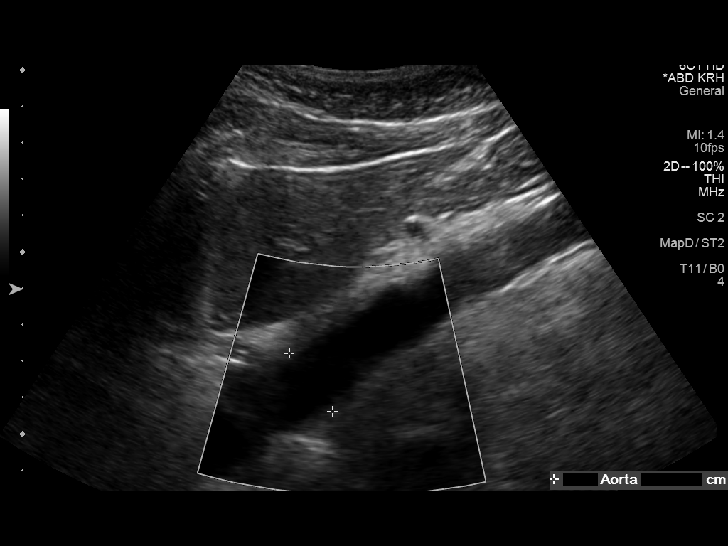
[im 6/65]
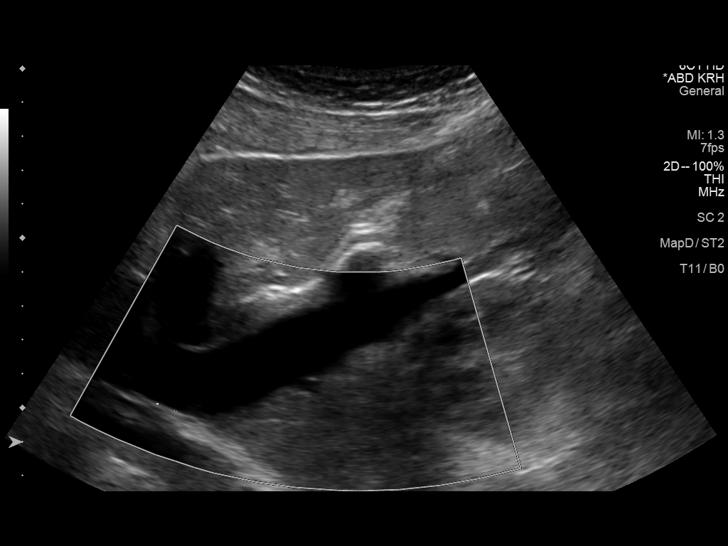
[im 11/65]
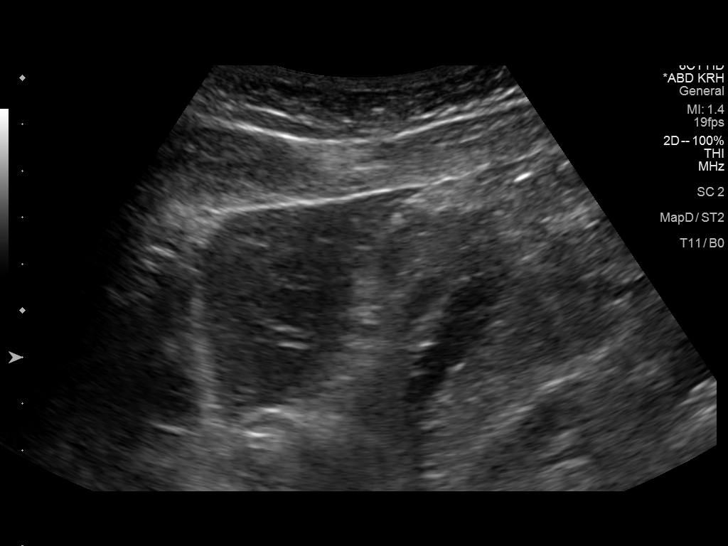
[im 17/65]
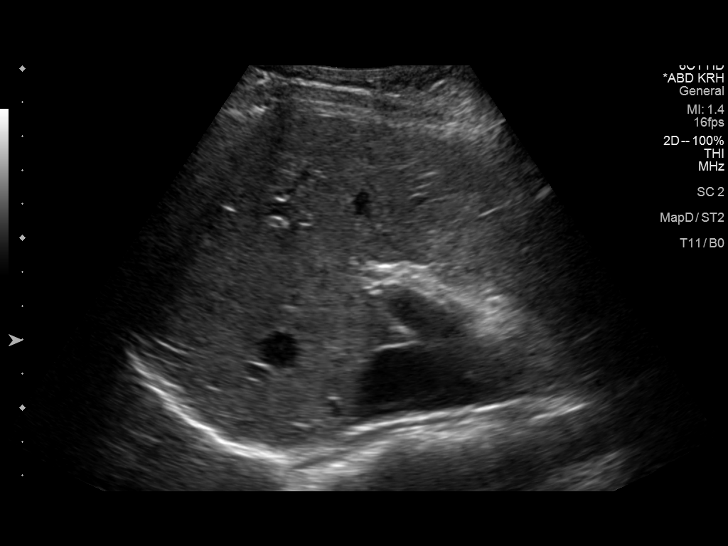
[im 22/65]
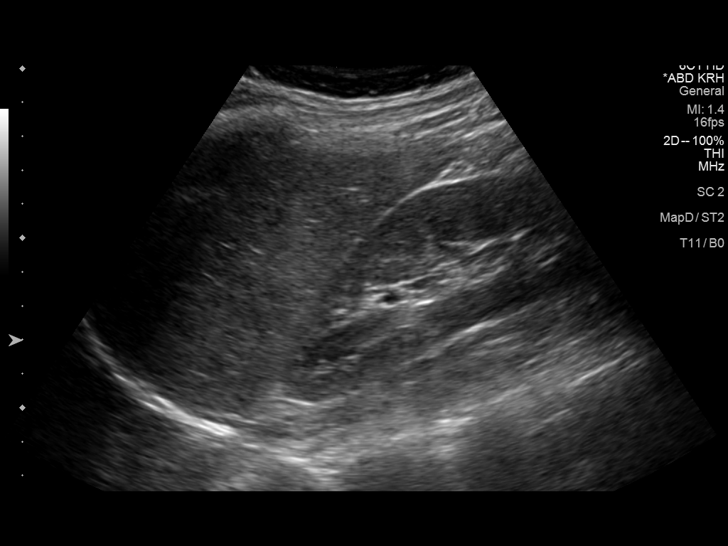
[im 25/65]
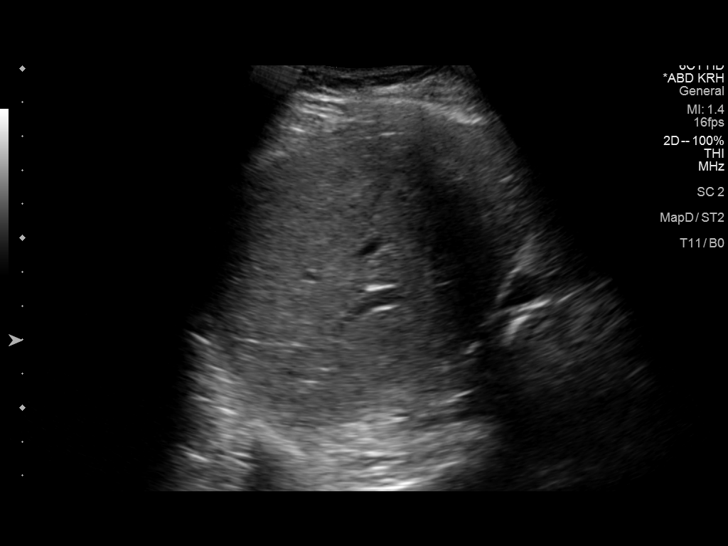
[im 30/65]
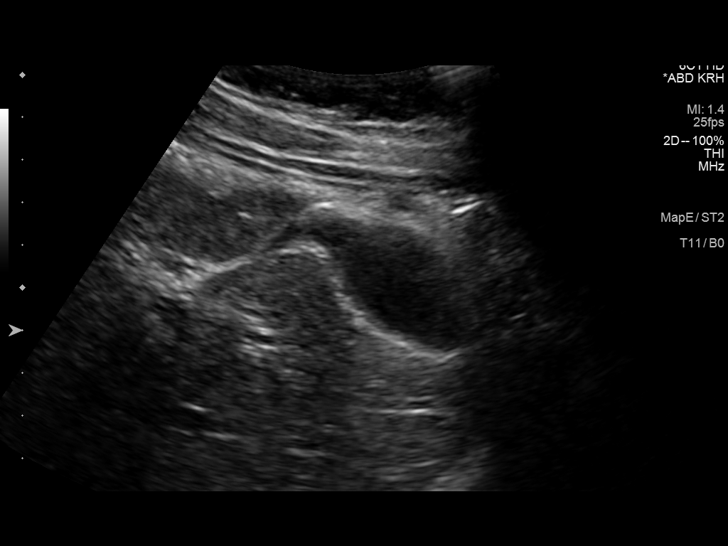
[im 35/65]
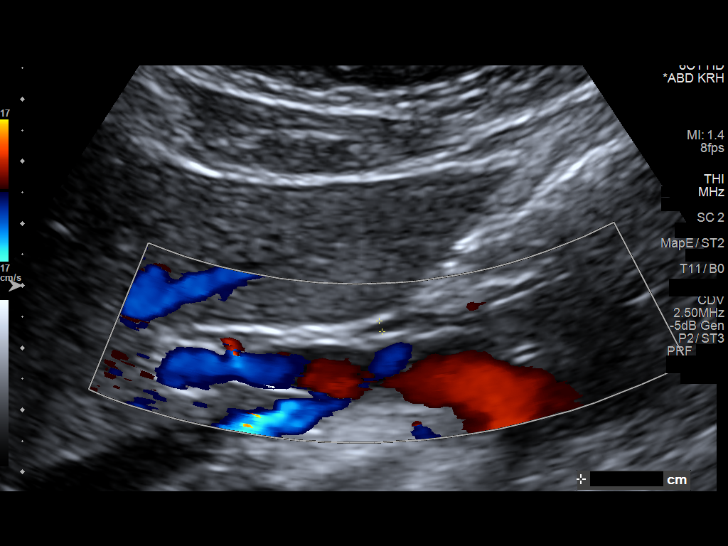
[im 41/65]
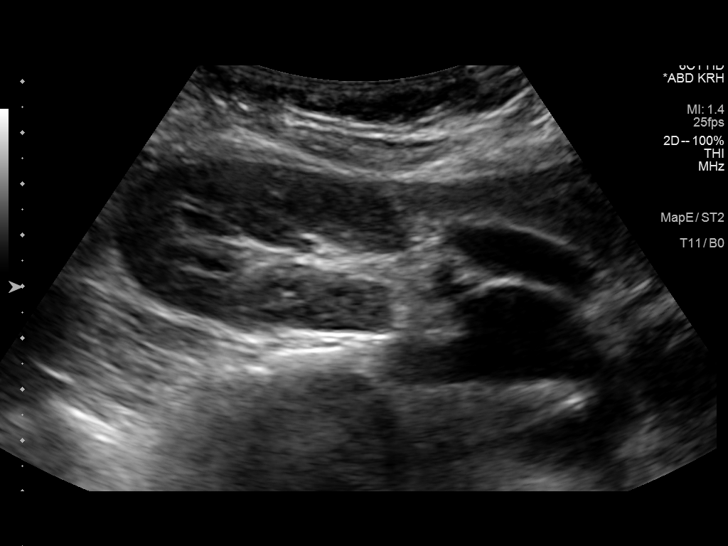
[im 43/65]
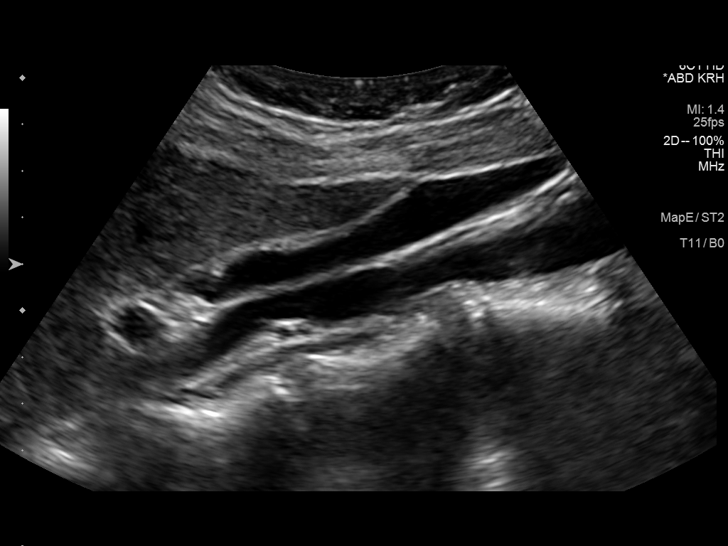
[im 49/65]
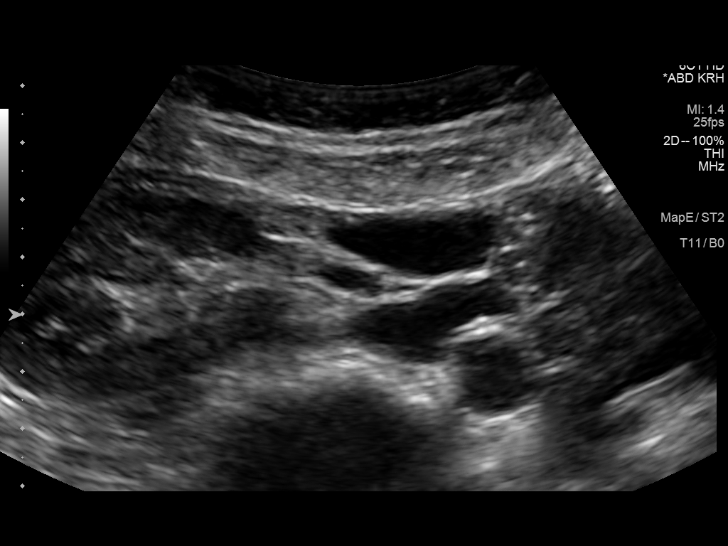
[im 54/65]
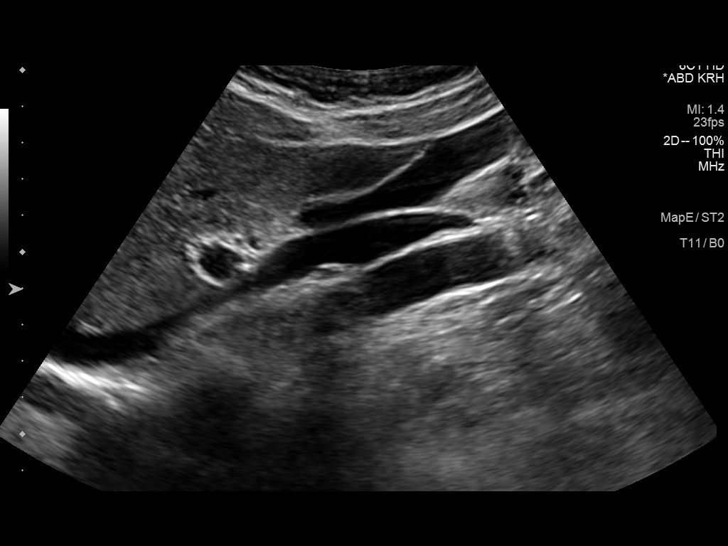
[im 59/65]
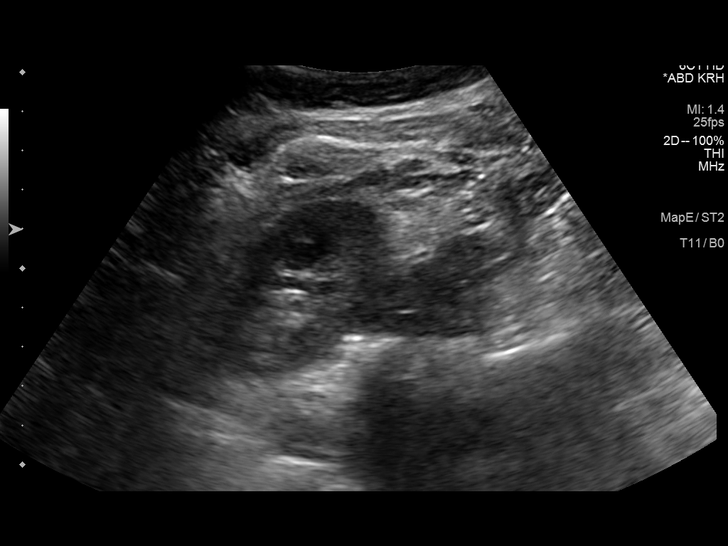
[im 65/65]
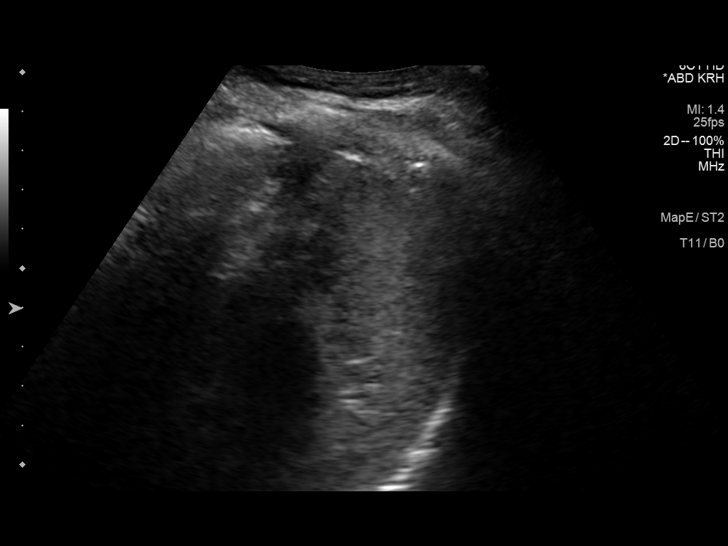

[14 of 25 positions shown; findings below may reference images not displayed]

FINDINGS: Gallbladder: No gallstones or wall thickening visualized. No
sonographic Murphy sign noted.

Common bile duct: Diameter: 2 mm

Liver: No focal lesion identified. Within normal limits in
parenchymal echogenicity.

IVC: No abnormality visualized.

Pancreas: Visualized portion unremarkable.

Spleen: Size and appearance within normal limits.

Right Kidney: Length: 11.2 cm. Echogenicity within normal limits. No
mass or hydronephrosis visualized.

Left Kidney: Length: 10.9 cm. Echogenicity within normal limits. No
mass or hydronephrosis visualized.

Abdominal aorta: No aneurysm visualized.

Other findings: None.
IMPRESSION: Normal abdominal ultrasound

## 2016-12-13 DIAGNOSIS — R42 Dizziness and giddiness: Secondary | ICD-10-CM | POA: Diagnosis not present

## 2017-02-28 DIAGNOSIS — H11123 Conjunctival concretions, bilateral: Secondary | ICD-10-CM | POA: Diagnosis not present

## 2017-07-12 DIAGNOSIS — Z6824 Body mass index (BMI) 24.0-24.9, adult: Secondary | ICD-10-CM | POA: Diagnosis not present

## 2017-07-12 DIAGNOSIS — Z118 Encounter for screening for other infectious and parasitic diseases: Secondary | ICD-10-CM | POA: Diagnosis not present

## 2017-07-12 DIAGNOSIS — Z1231 Encounter for screening mammogram for malignant neoplasm of breast: Secondary | ICD-10-CM | POA: Diagnosis not present

## 2017-07-12 DIAGNOSIS — Z13220 Encounter for screening for lipoid disorders: Secondary | ICD-10-CM | POA: Diagnosis not present

## 2017-07-12 DIAGNOSIS — Z131 Encounter for screening for diabetes mellitus: Secondary | ICD-10-CM | POA: Diagnosis not present

## 2017-07-12 DIAGNOSIS — Z01419 Encounter for gynecological examination (general) (routine) without abnormal findings: Secondary | ICD-10-CM | POA: Diagnosis not present

## 2017-07-12 DIAGNOSIS — Z0000 Encounter for general adult medical examination without abnormal findings: Secondary | ICD-10-CM | POA: Diagnosis not present

## 2018-02-06 ENCOUNTER — Encounter: Payer: Self-pay | Admitting: Family Medicine

## 2018-02-06 ENCOUNTER — Ambulatory Visit (INDEPENDENT_AMBULATORY_CARE_PROVIDER_SITE_OTHER): Payer: 59 | Admitting: Family Medicine

## 2018-02-06 VITALS — BP 106/75 | HR 61 | Ht 62.0 in | Wt 133.0 lb

## 2018-02-06 DIAGNOSIS — M25561 Pain in right knee: Secondary | ICD-10-CM

## 2018-02-06 NOTE — Progress Notes (Signed)
PCP: Kendrick RanchSchoenhoff, Deborah D, MD  Subjective:   HPI: Patient is a 47 y.o. female here for right anterior lateral knee pain for the past month.  Pain began while playing tennis.  Patient states she felt a twinge in the lateral knee which stopped momentarily.  She was able to continue playing tennis.  Her pain slightly improved in about 2 weeks with relative rest then later she went hiking which worsened her pain.  Pain currently 0/10.  She notes increased pain following activity but is largely not affected during activity.  She reports occasional slight swelling.  No erythema.  She does note some increased pigmentation of the skin over the lateral knee joint that developed shortly after the initial injury and has largely remained unchanged since then.  She denies any associated fever or chills she denies any numbness or tingling in the distal extremity.  Past Medical History:  Diagnosis Date  . Anxiety    DURING PREGNANCIES    Current Outpatient Medications on File Prior to Visit  Medication Sig Dispense Refill  . azithromycin (ZITHROMAX) 250 MG tablet Take as directed 6 tablet 0  . Cholecalciferol 50000 UNITS TABS Take one tablet once a week for 12 weeks 12 tablet 0  . clonazePAM (KLONOPIN) 0.5 MG tablet     . diphenhydrAMINE (BENADRYL) 25 MG tablet Take 25 mg by mouth every 6 (six) hours as needed for itching.    . valACYclovir (VALTREX) 500 MG tablet     . Vitamin D, Ergocalciferol, (DRISDOL) 50000 UNITS CAPS capsule Take 1 capsule (50,000 Units total) by mouth every 7 (seven) days. 8 capsule 0   No current facility-administered medications on file prior to visit.     No past surgical history on file.  No Known Allergies  Social History   Socioeconomic History  . Marital status: Married    Spouse name: Not on file  . Number of children: Not on file  . Years of education: Not on file  . Highest education level: Not on file  Occupational History  . Not on file  Social Needs  .  Financial resource strain: Not on file  . Food insecurity:    Worry: Not on file    Inability: Not on file  . Transportation needs:    Medical: Not on file    Non-medical: Not on file  Tobacco Use  . Smoking status: Never Smoker  . Smokeless tobacco: Never Used  Substance and Sexual Activity  . Alcohol use: No    Alcohol/week: 0.0 standard drinks  . Drug use: No  . Sexual activity: Yes    Birth control/protection: Condom  Lifestyle  . Physical activity:    Days per week: Not on file    Minutes per session: Not on file  . Stress: Not on file  Relationships  . Social connections:    Talks on phone: Not on file    Gets together: Not on file    Attends religious service: Not on file    Active member of club or organization: Not on file    Attends meetings of clubs or organizations: Not on file    Relationship status: Not on file  . Intimate partner violence:    Fear of current or ex partner: Not on file    Emotionally abused: Not on file    Physically abused: Not on file    Forced sexual activity: Not on file  Other Topics Concern  . Not on file  Social History  Narrative  . Not on file    Family History  Problem Relation Age of Onset  . Diabetes Mother   . Hyperlipidemia Mother   . Hypertension Mother   . Diabetes Father   . Hyperlipidemia Father   . Hypertension Father   . Heart attack Neg Hx   . Sudden death Neg Hx     BP 106/75   Pulse 61   Ht 5\' 2"  (1.575 m)   Wt 133 lb (60.3 kg)   BMI 24.33 kg/m   Review of Systems: See HPI above.     Objective:  Physical Exam:  Gen: awake, alert, NAD, comfortable in exam room Pulm: breathing unlabored  Right knee: - Inspection: no gross deformity. No swelling/effusion, erythema. There is approximately a 6 cm irregular area of hyperpigmentation over the anterior lateral knee.  It is not raised.  No excoriations. No redness - Palpation: no TTP - ROM: full active ROM with flexion and extension in knee and hip -  Strength: full strength on resisted flexion and extension - Neuro/vasc: Normal sensation - Special Tests: - LIGAMENTS: negative anterior and posterior drawer, negative Lachman's, no MCL or LCL laxity. Negative lever test. -- MENISCUS: negative McMurray's, negative Thessaly  -- PF JOINT: nml patellar mobility.  negative patellar grind  Left Knee: No deformity, swelling or skin changes No TTP Full ROM 5/5 strength No ligamentous laxity  Bilateral Hips: normal ROM, neg logroll    Assessment & Plan:  1. Right knee pain - unremarkable exam. No ligamentous instability. Suspect likely mild OA flare vs degenerative meniscus as underlying pathology - recommended ice 15 min 3-4times daily - ibuprofen 800mg  TID or Aleve 2 tablets twice daily - consider compression knee brace - concentric quad strengthening - f/u 6 weeks

## 2018-02-06 NOTE — Patient Instructions (Signed)
Your exam is reassuring. You likely flared some mild arthritis in this area, possibly a small degenerative meniscus tear. Both are treated conservatively and the pain should resolve. Icing 15 minutes at a time 3-4 times a day. Ibuprofen 600mg  three times a day OR aleve 2 tabs twice a day with food for pain and inflammation - typically take for 7-10 days then as needed. Straight leg raises, knee extensions 3 sets of 10 once a day - can add ankle weight if these become too easy. All activities as tolerated - you may want to wait about 2 weeks before trying to run, do squats, lunges, or leg press but, again, these are not dangerous. Compression sleeve for support when up and walking around. Follow up with me in 6 weeks or as needed.

## 2018-02-07 ENCOUNTER — Encounter: Payer: Self-pay | Admitting: Family Medicine

## 2019-06-01 ENCOUNTER — Encounter: Payer: Self-pay | Admitting: Family Medicine

## 2019-06-01 ENCOUNTER — Ambulatory Visit: Payer: Self-pay

## 2019-06-01 ENCOUNTER — Ambulatory Visit (INDEPENDENT_AMBULATORY_CARE_PROVIDER_SITE_OTHER): Payer: 59 | Admitting: Family Medicine

## 2019-06-01 ENCOUNTER — Other Ambulatory Visit: Payer: Self-pay

## 2019-06-01 VITALS — BP 136/87 | HR 81 | Ht 62.0 in | Wt 135.0 lb

## 2019-06-01 DIAGNOSIS — M25511 Pain in right shoulder: Secondary | ICD-10-CM

## 2019-06-01 DIAGNOSIS — M898X1 Other specified disorders of bone, shoulder: Secondary | ICD-10-CM

## 2019-06-01 NOTE — Assessment & Plan Note (Signed)
Appears to have a synovitis of the joint.  No significant degenerative changes appreciated.  Possible to be inflammatory.  Less likely for infection.  Has only started recently. -Provided samples of Duexis.  Can send more in. -Counseled on supportive care. -Follow-up in 3 months to rescan.  If still occurring would consider MRI to evaluate for tumor or other process.

## 2019-06-01 NOTE — Patient Instructions (Signed)
Nice to meet you Please try the duexis. We can send more if it helps  Please try ice   Please send me a message in MyChart with any questions or updates.  Please see me back in 3 months or sooner if needed.   --Dr. Raeford Razor

## 2019-06-01 NOTE — Progress Notes (Signed)
NIDA Christy Sims - 48 y.o. female MRN [id number]  Date of birth: May 24, 1971  SUBJECTIVE:  Including CC & ROS.  Chief Complaint  Patient presents with  . Shoulder Pain    right shoulder    Christy Sims is a 48 y.o. female that is presenting with right sternoclavicular joint pain.  Is been ongoing for a few weeks.  She notices the pain intermittently.  She cannot identify a specific source of the pain.  She notices it with certain movements and yoga.  It is intermittently occurring.  Can be moderate in severity.  Seems to localize at the joint and radiate laterally to the shoulder.  No history of similar symptoms.  No shortness of breath.  No history of surgery.  Has tried Advil 200 mg with limited improvement.  Seems like a throb and different than a muscle pain.   Review of Systems  Constitutional: Negative for fever.  HENT: Negative for congestion.   Respiratory: Negative for cough.   Cardiovascular: Negative for chest pain.  Gastrointestinal: Negative for abdominal distention.  Musculoskeletal: Negative for back pain.  Skin: Negative for color change.  Neurological: Negative for weakness.  Hematological: Negative for adenopathy.    HISTORY: Past Medical, Surgical, Social, and Family History Reviewed & Updated per EMR.   Pertinent Historical Findings include:  Past Medical History:  Diagnosis Date  . Anxiety    DURING PREGNANCIES    No past surgical history on file.  No Known Allergies  Family History  Problem Relation Age of Onset  . Diabetes Mother   . Hyperlipidemia Mother   . Hypertension Mother   . Diabetes Father   . Hyperlipidemia Father   . Hypertension Father   . Heart attack Neg Hx   . Sudden death Neg Hx      Social History   Socioeconomic History  . Marital status: Married    Spouse name: Not on file  . Number of children: Not on file  . Years of education: Not on file  . Highest education level: Not on file  Occupational History  . Not on file    Tobacco Use  . Smoking status: Never Smoker  . Smokeless tobacco: Never Used  Substance and Sexual Activity  . Alcohol use: No    Alcohol/week: 0.0 standard drinks  . Drug use: No  . Sexual activity: Yes    Birth control/protection: Condom  Other Topics Concern  . Not on file  Social History Narrative  . Not on file   Social Determinants of Health   Financial Resource Strain:   . Difficulty of Paying Living Expenses: Not on file  Food Insecurity:   . Worried About Programme researcher, broadcasting/film/video in the Last Year: Not on file  . Ran Out of Food in the Last Year: Not on file  Transportation Needs:   . Lack of Transportation (Medical): Not on file  . Lack of Transportation (Non-Medical): Not on file  Physical Activity:   . Days of Exercise per Week: Not on file  . Minutes of Exercise per Session: Not on file  Stress:   . Feeling of Stress : Not on file  Social Connections:   . Frequency of Communication with Friends and Family: Not on file  . Frequency of Social Gatherings with Friends and Family: Not on file  . Attends Religious Services: Not on file  . Active Member of Clubs or Organizations: Not on file  . Attends Banker Meetings: Not on  file  . Marital Status: Not on file  Intimate Partner Violence:   . Fear of Current or Ex-Partner: Not on file  . Emotionally Abused: Not on file  . Physically Abused: Not on file  . Sexually Abused: Not on file     PHYSICAL EXAM:  VS: BP 136/87   Pulse 81   Ht 5\' 2"  (1.575 m)   Wt 135 lb (61.2 kg)   BMI 24.69 kg/m  Physical Exam Gen: NAD, alert, cooperative with exam, well-appearing ENT: normal lips, normal nasal mucosa,  Eye: normal EOM, normal conjunctiva and lids CV:  no edema, +2 pedal pulses   Resp: no accessory muscle use, non-labored,   Skin: no rashes, no areas of induration  Neuro: normal tone, normal sensation to touch Psych:  normal insight, alert and oriented MSK:  Chest: Right sternoclavicular joint more  pronounced than left. Tenderness to palpation over the sternoclavicular joint on right. No overlying redness. Normal shoulder range of motion. Normal neck range of motion. Neurovascularly intact  Limited ultrasound: Right sternoclavicular joint:  The synovium appears to be thickened compared to the contralateral side.  There is increased Doppler over the sternum as opposed to the joint itself. No fracture of the clavicle  Summary: Findings suggestive synovitis of the sternoclavicular joint.  Ultrasound and interpretation by Clearance Coots, MD    ASSESSMENT & PLAN:   Pain of right sternoclavicular joint Appears to have a synovitis of the joint.  No significant degenerative changes appreciated.  Possible to be inflammatory.  Less likely for infection.  Has only started recently. -Provided samples of Duexis.  Can send more in. -Counseled on supportive care. -Follow-up in 3 months to rescan.  If still occurring would consider MRI to evaluate for tumor or other process.

## 2019-06-01 NOTE — Progress Notes (Signed)
Medication Samples have been provided to the patient.  Drug name: Duexis       Strength: 800mg /26.6mg         Qty: 1 box  LOT: [id number]  Exp.Date: 05/2020  Dosing instructions: Take 1 tablet by mouth three (3) times a day.  The patient has been instructed regarding the correct time, dose, and frequency of taking this medication, including desired effects and most common side effects.   Sherrie George, [place] 1:57 PM 06/01/2019

## 2022-09-24 ENCOUNTER — Encounter: Payer: Self-pay | Admitting: *Deleted
